# Patient Record
Sex: Male | Born: 1955
Health system: Southern US, Community
[De-identification: ages and names within clinical notes are randomized; demographics above are authoritative.]

## PROBLEM LIST (undated history)

## (undated) ENCOUNTER — Ambulatory Visit (HOSPITAL_BASED_OUTPATIENT_CLINIC_OR_DEPARTMENT_OTHER): Payer: PPO

## (undated) DIAGNOSIS — M791 Myalgia, unspecified site: Secondary | ICD-10-CM

## (undated) DIAGNOSIS — R413 Other amnesia: Secondary | ICD-10-CM

## (undated) DIAGNOSIS — M109 Gout, unspecified: Secondary | ICD-10-CM

## (undated) DIAGNOSIS — T7840XA Allergy, unspecified, initial encounter: Secondary | ICD-10-CM

## (undated) DIAGNOSIS — E119 Type 2 diabetes mellitus without complications: Secondary | ICD-10-CM

## (undated) DIAGNOSIS — E785 Hyperlipidemia, unspecified: Secondary | ICD-10-CM

## (undated) DIAGNOSIS — K5730 Diverticulosis of large intestine without perforation or abscess without bleeding: Secondary | ICD-10-CM

## (undated) DIAGNOSIS — D689 Coagulation defect, unspecified: Secondary | ICD-10-CM

## (undated) DIAGNOSIS — G4730 Sleep apnea, unspecified: Secondary | ICD-10-CM

## (undated) DIAGNOSIS — M810 Age-related osteoporosis without current pathological fracture: Secondary | ICD-10-CM

## (undated) DIAGNOSIS — F32A Depression, unspecified: Secondary | ICD-10-CM

## (undated) DIAGNOSIS — D649 Anemia, unspecified: Secondary | ICD-10-CM

## (undated) DIAGNOSIS — I10 Essential (primary) hypertension: Secondary | ICD-10-CM

## (undated) DIAGNOSIS — M1990 Unspecified osteoarthritis, unspecified site: Secondary | ICD-10-CM

## (undated) DIAGNOSIS — R5383 Other fatigue: Secondary | ICD-10-CM

## (undated) DIAGNOSIS — F329 Major depressive disorder, single episode, unspecified: Secondary | ICD-10-CM

## (undated) HISTORY — DX: Other amnesia: R41.3

## (undated) HISTORY — DX: Depression, unspecified: F32.A

## (undated) HISTORY — DX: Unspecified osteoarthritis, unspecified site: M19.90

## (undated) HISTORY — PX: SMALL INTESTINE SURGERY: SHX150

## (undated) HISTORY — DX: Hyperlipidemia, unspecified: E78.5

## (undated) HISTORY — DX: Diverticulosis of large intestine without perforation or abscess without bleeding: K57.30

## (undated) HISTORY — PX: LEG SURGERY: SHX1003

## (undated) HISTORY — DX: Type 2 diabetes mellitus without complications: E11.9

## (undated) HISTORY — DX: Age-related osteoporosis without current pathological fracture: M81.0

## (undated) HISTORY — PX: OTHER SURGICAL HISTORY: SHX169

## (undated) HISTORY — DX: Gout, unspecified: M10.9

## (undated) HISTORY — DX: Essential (primary) hypertension: I10

## (undated) HISTORY — PX: CARPAL TUNNEL WITH CUBITAL TUNNEL: SHX5608

## (undated) HISTORY — PX: NOSE SURGERY: SHX723

## (undated) HISTORY — DX: Anemia, unspecified: D64.9

## (undated) HISTORY — DX: Allergy, unspecified, initial encounter: T78.40XA

## (undated) HISTORY — DX: Myalgia, unspecified site: M79.10

## (undated) HISTORY — DX: Major depressive disorder, single episode, unspecified: F32.9

## (undated) HISTORY — DX: Sleep apnea, unspecified: G47.30

## (undated) HISTORY — DX: Other fatigue: R53.83

## (undated) HISTORY — DX: Coagulation defect, unspecified: D68.9

---

## 2002-10-03 DIAGNOSIS — C179 Malignant neoplasm of small intestine, unspecified: Secondary | ICD-10-CM | POA: Insufficient documentation

## 2003-01-10 DIAGNOSIS — Z5189 Encounter for other specified aftercare: Secondary | ICD-10-CM

## 2003-01-10 DIAGNOSIS — C801 Malignant (primary) neoplasm, unspecified: Secondary | ICD-10-CM

## 2003-01-10 HISTORY — DX: Malignant (primary) neoplasm, unspecified: C80.1

## 2003-01-10 HISTORY — DX: Encounter for other specified aftercare: Z51.89

## 2003-12-16 ENCOUNTER — Ambulatory Visit: Payer: Self-pay | Admitting: Oncology

## 2004-03-09 ENCOUNTER — Ambulatory Visit: Payer: Self-pay | Admitting: Oncology

## 2004-06-15 ENCOUNTER — Ambulatory Visit: Payer: Self-pay | Admitting: Oncology

## 2004-09-22 ENCOUNTER — Ambulatory Visit: Payer: Self-pay | Admitting: Oncology

## 2005-02-10 ENCOUNTER — Ambulatory Visit: Payer: Self-pay | Admitting: Oncology

## 2005-06-16 ENCOUNTER — Ambulatory Visit: Payer: Self-pay | Admitting: Oncology

## 2005-08-15 ENCOUNTER — Ambulatory Visit: Payer: Self-pay | Admitting: Oncology

## 2005-10-06 ENCOUNTER — Ambulatory Visit: Payer: Self-pay | Admitting: Oncology

## 2006-01-04 ENCOUNTER — Ambulatory Visit: Payer: Self-pay | Admitting: Oncology

## 2006-03-30 ENCOUNTER — Ambulatory Visit: Payer: Self-pay | Admitting: Oncology

## 2006-07-03 ENCOUNTER — Ambulatory Visit: Payer: Self-pay | Admitting: Oncology

## 2006-09-14 ENCOUNTER — Ambulatory Visit: Payer: Self-pay | Admitting: Oncology

## 2011-04-06 DIAGNOSIS — E1149 Type 2 diabetes mellitus with other diabetic neurological complication: Secondary | ICD-10-CM | POA: Diagnosis not present

## 2011-04-06 DIAGNOSIS — L608 Other nail disorders: Secondary | ICD-10-CM | POA: Diagnosis not present

## 2011-05-31 DIAGNOSIS — Z452 Encounter for adjustment and management of vascular access device: Secondary | ICD-10-CM | POA: Diagnosis not present

## 2011-05-31 DIAGNOSIS — C179 Malignant neoplasm of small intestine, unspecified: Secondary | ICD-10-CM | POA: Diagnosis not present

## 2011-08-24 DIAGNOSIS — E1149 Type 2 diabetes mellitus with other diabetic neurological complication: Secondary | ICD-10-CM | POA: Diagnosis not present

## 2011-08-24 DIAGNOSIS — L608 Other nail disorders: Secondary | ICD-10-CM | POA: Diagnosis not present

## 2011-11-15 DIAGNOSIS — IMO0002 Reserved for concepts with insufficient information to code with codable children: Secondary | ICD-10-CM | POA: Diagnosis not present

## 2011-11-15 DIAGNOSIS — E785 Hyperlipidemia, unspecified: Secondary | ICD-10-CM | POA: Diagnosis not present

## 2011-11-15 DIAGNOSIS — M109 Gout, unspecified: Secondary | ICD-10-CM | POA: Diagnosis not present

## 2011-11-15 DIAGNOSIS — E119 Type 2 diabetes mellitus without complications: Secondary | ICD-10-CM | POA: Diagnosis not present

## 2011-11-15 DIAGNOSIS — I10 Essential (primary) hypertension: Secondary | ICD-10-CM | POA: Diagnosis not present

## 2011-11-15 DIAGNOSIS — E291 Testicular hypofunction: Secondary | ICD-10-CM | POA: Diagnosis not present

## 2011-11-23 DIAGNOSIS — E1149 Type 2 diabetes mellitus with other diabetic neurological complication: Secondary | ICD-10-CM | POA: Diagnosis not present

## 2011-11-23 DIAGNOSIS — L608 Other nail disorders: Secondary | ICD-10-CM | POA: Diagnosis not present

## 2011-12-13 DIAGNOSIS — E119 Type 2 diabetes mellitus without complications: Secondary | ICD-10-CM | POA: Diagnosis not present

## 2011-12-13 DIAGNOSIS — R072 Precordial pain: Secondary | ICD-10-CM | POA: Diagnosis not present

## 2011-12-13 DIAGNOSIS — I10 Essential (primary) hypertension: Secondary | ICD-10-CM | POA: Diagnosis not present

## 2011-12-13 DIAGNOSIS — M109 Gout, unspecified: Secondary | ICD-10-CM | POA: Diagnosis not present

## 2011-12-13 DIAGNOSIS — R5383 Other fatigue: Secondary | ICD-10-CM | POA: Diagnosis not present

## 2011-12-13 DIAGNOSIS — R0602 Shortness of breath: Secondary | ICD-10-CM | POA: Diagnosis not present

## 2011-12-13 DIAGNOSIS — Z79899 Other long term (current) drug therapy: Secondary | ICD-10-CM | POA: Diagnosis not present

## 2011-12-13 DIAGNOSIS — Z8509 Personal history of malignant neoplasm of other digestive organs: Secondary | ICD-10-CM | POA: Diagnosis not present

## 2011-12-13 DIAGNOSIS — Z6841 Body Mass Index (BMI) 40.0 and over, adult: Secondary | ICD-10-CM | POA: Diagnosis not present

## 2011-12-13 DIAGNOSIS — R5381 Other malaise: Secondary | ICD-10-CM | POA: Diagnosis not present

## 2011-12-13 DIAGNOSIS — K219 Gastro-esophageal reflux disease without esophagitis: Secondary | ICD-10-CM | POA: Diagnosis not present

## 2011-12-13 DIAGNOSIS — E291 Testicular hypofunction: Secondary | ICD-10-CM | POA: Diagnosis not present

## 2011-12-14 DIAGNOSIS — Z0389 Encounter for observation for other suspected diseases and conditions ruled out: Secondary | ICD-10-CM | POA: Diagnosis not present

## 2011-12-14 DIAGNOSIS — R079 Chest pain, unspecified: Secondary | ICD-10-CM | POA: Diagnosis not present

## 2011-12-14 DIAGNOSIS — R0789 Other chest pain: Secondary | ICD-10-CM | POA: Diagnosis not present

## 2011-12-14 DIAGNOSIS — M109 Gout, unspecified: Secondary | ICD-10-CM | POA: Diagnosis not present

## 2011-12-14 DIAGNOSIS — I10 Essential (primary) hypertension: Secondary | ICD-10-CM | POA: Diagnosis not present

## 2011-12-15 DIAGNOSIS — M109 Gout, unspecified: Secondary | ICD-10-CM | POA: Diagnosis not present

## 2011-12-15 DIAGNOSIS — I10 Essential (primary) hypertension: Secondary | ICD-10-CM | POA: Diagnosis not present

## 2011-12-15 DIAGNOSIS — R079 Chest pain, unspecified: Secondary | ICD-10-CM | POA: Diagnosis not present

## 2011-12-27 DIAGNOSIS — I10 Essential (primary) hypertension: Secondary | ICD-10-CM | POA: Diagnosis not present

## 2011-12-27 DIAGNOSIS — Z6841 Body Mass Index (BMI) 40.0 and over, adult: Secondary | ICD-10-CM | POA: Diagnosis not present

## 2011-12-27 DIAGNOSIS — K219 Gastro-esophageal reflux disease without esophagitis: Secondary | ICD-10-CM | POA: Diagnosis not present

## 2011-12-27 DIAGNOSIS — R0789 Other chest pain: Secondary | ICD-10-CM | POA: Diagnosis not present

## 2012-02-12 DIAGNOSIS — M109 Gout, unspecified: Secondary | ICD-10-CM | POA: Diagnosis not present

## 2012-02-12 DIAGNOSIS — N390 Urinary tract infection, site not specified: Secondary | ICD-10-CM | POA: Diagnosis not present

## 2012-02-12 DIAGNOSIS — E119 Type 2 diabetes mellitus without complications: Secondary | ICD-10-CM | POA: Diagnosis not present

## 2012-02-12 DIAGNOSIS — I10 Essential (primary) hypertension: Secondary | ICD-10-CM | POA: Diagnosis not present

## 2012-02-12 DIAGNOSIS — Z6841 Body Mass Index (BMI) 40.0 and over, adult: Secondary | ICD-10-CM | POA: Diagnosis not present

## 2012-02-12 DIAGNOSIS — E291 Testicular hypofunction: Secondary | ICD-10-CM | POA: Diagnosis not present

## 2012-03-13 DIAGNOSIS — J019 Acute sinusitis, unspecified: Secondary | ICD-10-CM | POA: Diagnosis not present

## 2012-03-13 DIAGNOSIS — Z85038 Personal history of other malignant neoplasm of large intestine: Secondary | ICD-10-CM | POA: Diagnosis not present

## 2012-03-13 DIAGNOSIS — Z09 Encounter for follow-up examination after completed treatment for conditions other than malignant neoplasm: Secondary | ICD-10-CM | POA: Diagnosis not present

## 2012-03-14 DIAGNOSIS — L608 Other nail disorders: Secondary | ICD-10-CM | POA: Diagnosis not present

## 2012-03-14 DIAGNOSIS — E1149 Type 2 diabetes mellitus with other diabetic neurological complication: Secondary | ICD-10-CM | POA: Diagnosis not present

## 2012-05-30 DIAGNOSIS — M109 Gout, unspecified: Secondary | ICD-10-CM | POA: Diagnosis not present

## 2012-05-30 DIAGNOSIS — E119 Type 2 diabetes mellitus without complications: Secondary | ICD-10-CM | POA: Diagnosis not present

## 2012-05-30 DIAGNOSIS — I10 Essential (primary) hypertension: Secondary | ICD-10-CM | POA: Diagnosis not present

## 2012-05-30 DIAGNOSIS — E291 Testicular hypofunction: Secondary | ICD-10-CM | POA: Diagnosis not present

## 2012-05-31 DIAGNOSIS — M79609 Pain in unspecified limb: Secondary | ICD-10-CM | POA: Diagnosis not present

## 2012-05-31 DIAGNOSIS — I10 Essential (primary) hypertension: Secondary | ICD-10-CM | POA: Diagnosis not present

## 2012-05-31 DIAGNOSIS — M7989 Other specified soft tissue disorders: Secondary | ICD-10-CM | POA: Diagnosis not present

## 2012-06-01 DIAGNOSIS — M79609 Pain in unspecified limb: Secondary | ICD-10-CM | POA: Diagnosis not present

## 2012-06-01 DIAGNOSIS — R609 Edema, unspecified: Secondary | ICD-10-CM | POA: Diagnosis not present

## 2012-06-01 DIAGNOSIS — IMO0002 Reserved for concepts with insufficient information to code with codable children: Secondary | ICD-10-CM | POA: Diagnosis not present

## 2012-06-01 DIAGNOSIS — M7989 Other specified soft tissue disorders: Secondary | ICD-10-CM | POA: Diagnosis not present

## 2012-06-01 DIAGNOSIS — I10 Essential (primary) hypertension: Secondary | ICD-10-CM | POA: Diagnosis not present

## 2012-06-02 DIAGNOSIS — E119 Type 2 diabetes mellitus without complications: Secondary | ICD-10-CM | POA: Diagnosis not present

## 2012-06-02 DIAGNOSIS — I10 Essential (primary) hypertension: Secondary | ICD-10-CM | POA: Diagnosis not present

## 2012-06-02 DIAGNOSIS — IMO0002 Reserved for concepts with insufficient information to code with codable children: Secondary | ICD-10-CM | POA: Diagnosis not present

## 2012-06-28 DIAGNOSIS — E119 Type 2 diabetes mellitus without complications: Secondary | ICD-10-CM | POA: Diagnosis not present

## 2012-07-18 DIAGNOSIS — L608 Other nail disorders: Secondary | ICD-10-CM | POA: Diagnosis not present

## 2012-07-18 DIAGNOSIS — E1149 Type 2 diabetes mellitus with other diabetic neurological complication: Secondary | ICD-10-CM | POA: Diagnosis not present

## 2012-09-13 DIAGNOSIS — C179 Malignant neoplasm of small intestine, unspecified: Secondary | ICD-10-CM | POA: Diagnosis not present

## 2012-09-13 DIAGNOSIS — Z452 Encounter for adjustment and management of vascular access device: Secondary | ICD-10-CM | POA: Diagnosis not present

## 2012-09-23 DIAGNOSIS — R42 Dizziness and giddiness: Secondary | ICD-10-CM | POA: Diagnosis not present

## 2012-09-23 DIAGNOSIS — I059 Rheumatic mitral valve disease, unspecified: Secondary | ICD-10-CM | POA: Diagnosis present

## 2012-09-23 DIAGNOSIS — G4733 Obstructive sleep apnea (adult) (pediatric): Secondary | ICD-10-CM | POA: Diagnosis not present

## 2012-09-23 DIAGNOSIS — R9431 Abnormal electrocardiogram [ECG] [EKG]: Secondary | ICD-10-CM | POA: Diagnosis not present

## 2012-09-23 DIAGNOSIS — I2699 Other pulmonary embolism without acute cor pulmonale: Secondary | ICD-10-CM | POA: Diagnosis not present

## 2012-09-23 DIAGNOSIS — I82409 Acute embolism and thrombosis of unspecified deep veins of unspecified lower extremity: Secondary | ICD-10-CM | POA: Diagnosis not present

## 2012-09-23 DIAGNOSIS — I2119 ST elevation (STEMI) myocardial infarction involving other coronary artery of inferior wall: Secondary | ICD-10-CM | POA: Diagnosis not present

## 2012-09-23 DIAGNOSIS — Z87891 Personal history of nicotine dependence: Secondary | ICD-10-CM | POA: Diagnosis not present

## 2012-09-23 DIAGNOSIS — I519 Heart disease, unspecified: Secondary | ICD-10-CM | POA: Diagnosis not present

## 2012-09-23 DIAGNOSIS — Z9889 Other specified postprocedural states: Secondary | ICD-10-CM | POA: Diagnosis not present

## 2012-09-23 DIAGNOSIS — R079 Chest pain, unspecified: Secondary | ICD-10-CM | POA: Diagnosis not present

## 2012-09-23 DIAGNOSIS — Z9221 Personal history of antineoplastic chemotherapy: Secondary | ICD-10-CM | POA: Diagnosis not present

## 2012-09-23 DIAGNOSIS — I2692 Saddle embolus of pulmonary artery without acute cor pulmonale: Secondary | ICD-10-CM | POA: Insufficient documentation

## 2012-09-23 DIAGNOSIS — Z8509 Personal history of malignant neoplasm of other digestive organs: Secondary | ICD-10-CM | POA: Diagnosis not present

## 2012-09-23 DIAGNOSIS — E119 Type 2 diabetes mellitus without complications: Secondary | ICD-10-CM | POA: Diagnosis not present

## 2012-09-23 DIAGNOSIS — C179 Malignant neoplasm of small intestine, unspecified: Secondary | ICD-10-CM | POA: Diagnosis not present

## 2012-09-23 DIAGNOSIS — Z452 Encounter for adjustment and management of vascular access device: Secondary | ICD-10-CM | POA: Diagnosis not present

## 2012-09-23 DIAGNOSIS — I369 Nonrheumatic tricuspid valve disorder, unspecified: Secondary | ICD-10-CM | POA: Diagnosis present

## 2012-09-23 DIAGNOSIS — R0602 Shortness of breath: Secondary | ICD-10-CM | POA: Diagnosis not present

## 2012-09-23 DIAGNOSIS — I10 Essential (primary) hypertension: Secondary | ICD-10-CM | POA: Diagnosis not present

## 2012-09-23 DIAGNOSIS — M109 Gout, unspecified: Secondary | ICD-10-CM | POA: Diagnosis not present

## 2012-09-23 DIAGNOSIS — I379 Nonrheumatic pulmonary valve disorder, unspecified: Secondary | ICD-10-CM | POA: Diagnosis present

## 2012-09-26 DIAGNOSIS — I82402 Acute embolism and thrombosis of unspecified deep veins of left lower extremity: Secondary | ICD-10-CM | POA: Insufficient documentation

## 2012-09-26 DIAGNOSIS — M109 Gout, unspecified: Secondary | ICD-10-CM | POA: Insufficient documentation

## 2012-10-02 DIAGNOSIS — I2692 Saddle embolus of pulmonary artery without acute cor pulmonale: Secondary | ICD-10-CM | POA: Diagnosis not present

## 2012-10-02 DIAGNOSIS — I743 Embolism and thrombosis of arteries of the lower extremities: Secondary | ICD-10-CM | POA: Diagnosis not present

## 2012-10-03 DIAGNOSIS — R609 Edema, unspecified: Secondary | ICD-10-CM | POA: Diagnosis not present

## 2012-10-03 DIAGNOSIS — Z79899 Other long term (current) drug therapy: Secondary | ICD-10-CM | POA: Diagnosis not present

## 2012-10-03 DIAGNOSIS — E119 Type 2 diabetes mellitus without complications: Secondary | ICD-10-CM | POA: Diagnosis not present

## 2012-10-03 DIAGNOSIS — I743 Embolism and thrombosis of arteries of the lower extremities: Secondary | ICD-10-CM | POA: Diagnosis not present

## 2012-10-03 DIAGNOSIS — I2692 Saddle embolus of pulmonary artery without acute cor pulmonale: Secondary | ICD-10-CM | POA: Diagnosis not present

## 2012-10-03 DIAGNOSIS — Z6841 Body Mass Index (BMI) 40.0 and over, adult: Secondary | ICD-10-CM | POA: Diagnosis not present

## 2012-10-03 DIAGNOSIS — I10 Essential (primary) hypertension: Secondary | ICD-10-CM | POA: Diagnosis not present

## 2012-10-07 DIAGNOSIS — I743 Embolism and thrombosis of arteries of the lower extremities: Secondary | ICD-10-CM | POA: Diagnosis not present

## 2012-10-07 DIAGNOSIS — I2692 Saddle embolus of pulmonary artery without acute cor pulmonale: Secondary | ICD-10-CM | POA: Diagnosis not present

## 2012-10-08 DIAGNOSIS — IMO0002 Reserved for concepts with insufficient information to code with codable children: Secondary | ICD-10-CM | POA: Diagnosis not present

## 2012-10-08 DIAGNOSIS — J869 Pyothorax without fistula: Secondary | ICD-10-CM | POA: Diagnosis not present

## 2012-10-08 DIAGNOSIS — I10 Essential (primary) hypertension: Secondary | ICD-10-CM | POA: Diagnosis not present

## 2012-10-08 DIAGNOSIS — Z7901 Long term (current) use of anticoagulants: Secondary | ICD-10-CM | POA: Diagnosis not present

## 2012-10-08 DIAGNOSIS — L02219 Cutaneous abscess of trunk, unspecified: Secondary | ICD-10-CM | POA: Diagnosis not present

## 2012-10-08 DIAGNOSIS — E119 Type 2 diabetes mellitus without complications: Secondary | ICD-10-CM | POA: Diagnosis not present

## 2012-10-08 DIAGNOSIS — Z23 Encounter for immunization: Secondary | ICD-10-CM | POA: Diagnosis not present

## 2012-10-10 DIAGNOSIS — Z6839 Body mass index (BMI) 39.0-39.9, adult: Secondary | ICD-10-CM | POA: Diagnosis not present

## 2012-10-10 DIAGNOSIS — E119 Type 2 diabetes mellitus without complications: Secondary | ICD-10-CM | POA: Diagnosis not present

## 2012-10-10 DIAGNOSIS — I10 Essential (primary) hypertension: Secondary | ICD-10-CM | POA: Diagnosis not present

## 2012-10-10 DIAGNOSIS — I82409 Acute embolism and thrombosis of unspecified deep veins of unspecified lower extremity: Secondary | ICD-10-CM | POA: Diagnosis not present

## 2012-10-10 DIAGNOSIS — R791 Abnormal coagulation profile: Secondary | ICD-10-CM | POA: Diagnosis not present

## 2012-10-10 DIAGNOSIS — I2692 Saddle embolus of pulmonary artery without acute cor pulmonale: Secondary | ICD-10-CM | POA: Diagnosis not present

## 2012-10-10 DIAGNOSIS — Z79899 Other long term (current) drug therapy: Secondary | ICD-10-CM | POA: Diagnosis not present

## 2012-10-10 DIAGNOSIS — I743 Embolism and thrombosis of arteries of the lower extremities: Secondary | ICD-10-CM | POA: Diagnosis not present

## 2012-10-11 DIAGNOSIS — I2692 Saddle embolus of pulmonary artery without acute cor pulmonale: Secondary | ICD-10-CM | POA: Diagnosis not present

## 2012-10-11 DIAGNOSIS — I743 Embolism and thrombosis of arteries of the lower extremities: Secondary | ICD-10-CM | POA: Diagnosis not present

## 2012-10-13 DIAGNOSIS — I743 Embolism and thrombosis of arteries of the lower extremities: Secondary | ICD-10-CM | POA: Diagnosis not present

## 2012-10-13 DIAGNOSIS — I2692 Saddle embolus of pulmonary artery without acute cor pulmonale: Secondary | ICD-10-CM | POA: Diagnosis not present

## 2012-10-14 DIAGNOSIS — I743 Embolism and thrombosis of arteries of the lower extremities: Secondary | ICD-10-CM | POA: Diagnosis not present

## 2012-10-14 DIAGNOSIS — I2692 Saddle embolus of pulmonary artery without acute cor pulmonale: Secondary | ICD-10-CM | POA: Diagnosis not present

## 2012-10-15 DIAGNOSIS — L02219 Cutaneous abscess of trunk, unspecified: Secondary | ICD-10-CM | POA: Diagnosis not present

## 2012-10-16 DIAGNOSIS — I2692 Saddle embolus of pulmonary artery without acute cor pulmonale: Secondary | ICD-10-CM | POA: Diagnosis not present

## 2012-10-16 DIAGNOSIS — I743 Embolism and thrombosis of arteries of the lower extremities: Secondary | ICD-10-CM | POA: Diagnosis not present

## 2012-10-17 ENCOUNTER — Ambulatory Visit: Payer: Self-pay

## 2012-10-17 DIAGNOSIS — I2692 Saddle embolus of pulmonary artery without acute cor pulmonale: Secondary | ICD-10-CM | POA: Diagnosis not present

## 2012-10-17 DIAGNOSIS — I743 Embolism and thrombosis of arteries of the lower extremities: Secondary | ICD-10-CM | POA: Diagnosis not present

## 2012-10-21 DIAGNOSIS — I743 Embolism and thrombosis of arteries of the lower extremities: Secondary | ICD-10-CM | POA: Diagnosis not present

## 2012-10-21 DIAGNOSIS — I2692 Saddle embolus of pulmonary artery without acute cor pulmonale: Secondary | ICD-10-CM | POA: Diagnosis not present

## 2012-10-23 DIAGNOSIS — I2692 Saddle embolus of pulmonary artery without acute cor pulmonale: Secondary | ICD-10-CM | POA: Diagnosis not present

## 2012-10-23 DIAGNOSIS — I743 Embolism and thrombosis of arteries of the lower extremities: Secondary | ICD-10-CM | POA: Diagnosis not present

## 2012-10-24 DIAGNOSIS — I2692 Saddle embolus of pulmonary artery without acute cor pulmonale: Secondary | ICD-10-CM | POA: Diagnosis not present

## 2012-10-24 DIAGNOSIS — I743 Embolism and thrombosis of arteries of the lower extremities: Secondary | ICD-10-CM | POA: Diagnosis not present

## 2012-11-07 DIAGNOSIS — D649 Anemia, unspecified: Secondary | ICD-10-CM | POA: Diagnosis not present

## 2012-11-08 DIAGNOSIS — R791 Abnormal coagulation profile: Secondary | ICD-10-CM | POA: Diagnosis not present

## 2012-11-08 DIAGNOSIS — S21109A Unspecified open wound of unspecified front wall of thorax without penetration into thoracic cavity, initial encounter: Secondary | ICD-10-CM | POA: Diagnosis not present

## 2012-11-08 DIAGNOSIS — M5137 Other intervertebral disc degeneration, lumbosacral region: Secondary | ICD-10-CM | POA: Diagnosis not present

## 2012-11-08 DIAGNOSIS — M1710 Unilateral primary osteoarthritis, unspecified knee: Secondary | ICD-10-CM | POA: Diagnosis not present

## 2012-11-08 DIAGNOSIS — Z6839 Body mass index (BMI) 39.0-39.9, adult: Secondary | ICD-10-CM | POA: Diagnosis not present

## 2012-11-15 DIAGNOSIS — E119 Type 2 diabetes mellitus without complications: Secondary | ICD-10-CM | POA: Diagnosis not present

## 2012-11-15 DIAGNOSIS — I10 Essential (primary) hypertension: Secondary | ICD-10-CM | POA: Diagnosis not present

## 2012-11-15 DIAGNOSIS — I82409 Acute embolism and thrombosis of unspecified deep veins of unspecified lower extremity: Secondary | ICD-10-CM | POA: Diagnosis not present

## 2012-11-15 DIAGNOSIS — Z79899 Other long term (current) drug therapy: Secondary | ICD-10-CM | POA: Diagnosis not present

## 2012-11-15 DIAGNOSIS — I2692 Saddle embolus of pulmonary artery without acute cor pulmonale: Secondary | ICD-10-CM | POA: Diagnosis not present

## 2012-11-15 DIAGNOSIS — R791 Abnormal coagulation profile: Secondary | ICD-10-CM | POA: Diagnosis not present

## 2012-11-29 DIAGNOSIS — Z7901 Long term (current) use of anticoagulants: Secondary | ICD-10-CM | POA: Diagnosis not present

## 2012-12-13 DIAGNOSIS — Z7901 Long term (current) use of anticoagulants: Secondary | ICD-10-CM | POA: Diagnosis not present

## 2012-12-20 DIAGNOSIS — E119 Type 2 diabetes mellitus without complications: Secondary | ICD-10-CM | POA: Diagnosis not present

## 2012-12-20 DIAGNOSIS — I82409 Acute embolism and thrombosis of unspecified deep veins of unspecified lower extremity: Secondary | ICD-10-CM | POA: Diagnosis not present

## 2012-12-20 DIAGNOSIS — I10 Essential (primary) hypertension: Secondary | ICD-10-CM | POA: Diagnosis not present

## 2012-12-20 DIAGNOSIS — I2692 Saddle embolus of pulmonary artery without acute cor pulmonale: Secondary | ICD-10-CM | POA: Diagnosis not present

## 2012-12-20 DIAGNOSIS — Z79899 Other long term (current) drug therapy: Secondary | ICD-10-CM | POA: Diagnosis not present

## 2013-01-14 DIAGNOSIS — Z7901 Long term (current) use of anticoagulants: Secondary | ICD-10-CM | POA: Diagnosis not present

## 2013-01-30 DIAGNOSIS — I82409 Acute embolism and thrombosis of unspecified deep veins of unspecified lower extremity: Secondary | ICD-10-CM | POA: Diagnosis not present

## 2013-01-30 DIAGNOSIS — Z452 Encounter for adjustment and management of vascular access device: Secondary | ICD-10-CM | POA: Diagnosis not present

## 2013-01-31 DIAGNOSIS — H6120 Impacted cerumen, unspecified ear: Secondary | ICD-10-CM | POA: Diagnosis not present

## 2013-01-31 DIAGNOSIS — Z6841 Body Mass Index (BMI) 40.0 and over, adult: Secondary | ICD-10-CM | POA: Diagnosis not present

## 2013-01-31 DIAGNOSIS — Z125 Encounter for screening for malignant neoplasm of prostate: Secondary | ICD-10-CM | POA: Diagnosis not present

## 2013-01-31 DIAGNOSIS — E119 Type 2 diabetes mellitus without complications: Secondary | ICD-10-CM | POA: Diagnosis not present

## 2013-01-31 DIAGNOSIS — I10 Essential (primary) hypertension: Secondary | ICD-10-CM | POA: Diagnosis not present

## 2013-01-31 DIAGNOSIS — E785 Hyperlipidemia, unspecified: Secondary | ICD-10-CM | POA: Diagnosis not present

## 2013-01-31 DIAGNOSIS — Z79899 Other long term (current) drug therapy: Secondary | ICD-10-CM | POA: Diagnosis not present

## 2013-01-31 DIAGNOSIS — M109 Gout, unspecified: Secondary | ICD-10-CM | POA: Diagnosis not present

## 2013-02-13 ENCOUNTER — Ambulatory Visit: Payer: Self-pay

## 2013-03-08 DIAGNOSIS — S21109A Unspecified open wound of unspecified front wall of thorax without penetration into thoracic cavity, initial encounter: Secondary | ICD-10-CM | POA: Diagnosis not present

## 2013-03-08 DIAGNOSIS — Z6841 Body Mass Index (BMI) 40.0 and over, adult: Secondary | ICD-10-CM | POA: Diagnosis not present

## 2013-03-08 DIAGNOSIS — B356 Tinea cruris: Secondary | ICD-10-CM | POA: Diagnosis not present

## 2013-03-08 DIAGNOSIS — Z7901 Long term (current) use of anticoagulants: Secondary | ICD-10-CM | POA: Diagnosis not present

## 2013-03-13 DIAGNOSIS — Z7901 Long term (current) use of anticoagulants: Secondary | ICD-10-CM | POA: Diagnosis not present

## 2013-04-15 DIAGNOSIS — R791 Abnormal coagulation profile: Secondary | ICD-10-CM | POA: Diagnosis not present

## 2013-04-22 DIAGNOSIS — R791 Abnormal coagulation profile: Secondary | ICD-10-CM | POA: Diagnosis not present

## 2013-04-29 DIAGNOSIS — R791 Abnormal coagulation profile: Secondary | ICD-10-CM | POA: Diagnosis not present

## 2013-05-07 DIAGNOSIS — M109 Gout, unspecified: Secondary | ICD-10-CM | POA: Diagnosis not present

## 2013-05-07 DIAGNOSIS — Z6839 Body mass index (BMI) 39.0-39.9, adult: Secondary | ICD-10-CM | POA: Diagnosis not present

## 2013-05-07 DIAGNOSIS — E1149 Type 2 diabetes mellitus with other diabetic neurological complication: Secondary | ICD-10-CM | POA: Diagnosis not present

## 2013-05-07 DIAGNOSIS — E785 Hyperlipidemia, unspecified: Secondary | ICD-10-CM | POA: Diagnosis not present

## 2013-05-07 DIAGNOSIS — I10 Essential (primary) hypertension: Secondary | ICD-10-CM | POA: Diagnosis not present

## 2013-05-07 DIAGNOSIS — Z79899 Other long term (current) drug therapy: Secondary | ICD-10-CM | POA: Diagnosis not present

## 2013-05-08 ENCOUNTER — Ambulatory Visit (INDEPENDENT_AMBULATORY_CARE_PROVIDER_SITE_OTHER): Payer: Medicare Other

## 2013-05-08 VITALS — BP 156/109 | HR 70 | Resp 18

## 2013-05-08 DIAGNOSIS — E1140 Type 2 diabetes mellitus with diabetic neuropathy, unspecified: Secondary | ICD-10-CM

## 2013-05-08 DIAGNOSIS — L97509 Non-pressure chronic ulcer of other part of unspecified foot with unspecified severity: Secondary | ICD-10-CM

## 2013-05-08 DIAGNOSIS — M2020 Hallux rigidus, unspecified foot: Secondary | ICD-10-CM

## 2013-05-08 DIAGNOSIS — M1990 Unspecified osteoarthritis, unspecified site: Secondary | ICD-10-CM

## 2013-05-08 DIAGNOSIS — E1149 Type 2 diabetes mellitus with other diabetic neurological complication: Secondary | ICD-10-CM | POA: Diagnosis not present

## 2013-05-08 DIAGNOSIS — Q828 Other specified congenital malformations of skin: Secondary | ICD-10-CM

## 2013-05-08 DIAGNOSIS — L608 Other nail disorders: Secondary | ICD-10-CM | POA: Diagnosis not present

## 2013-05-08 MED ORDER — SILVER SULFADIAZINE 1 % EX CREA: 1.0000 "application " | TOPICAL_CREAM | Freq: Every day | CUTANEOUS | Status: DC

## 2013-05-08 NOTE — Patient Instructions (Signed)
Diabetes and Foot Care Diabetes may cause you to have problems because of poor blood supply (circulation) to your feet and legs. This may cause the skin on your feet to become thinner, break easier, and heal more slowly. Your skin may become dry, and the skin may peel and crack. You may also have nerve damage in your legs and feet causing decreased feeling in them. You may not notice minor injuries to your feet that could lead to infections or more serious problems. Taking care of your feet is one of the most important things you can do for yourself.  HOME CARE INSTRUCTIONS  Wear shoes at all times, even in the house. Do not go barefoot. Bare feet are easily injured.  Check your feet daily for blisters, cuts, and redness. If you cannot see the bottom of your feet, use a mirror or ask someone for help.  Wash your feet with warm water (do not use hot water) and mild soap. Then pat your feet and the areas between your toes until they are completely dry. Do not soak your feet as this can dry your skin.  Apply a moisturizing lotion or petroleum jelly (that does not contain alcohol and is unscented) to the skin on your feet and to dry, brittle toenails. Do not apply lotion between your toes.  Trim your toenails straight across. Do not dig under them or around the cuticle. File the edges of your nails with an emery board or nail file.  Do not cut corns or calluses or try to remove them with medicine.  Wear clean socks or stockings every day. Make sure they are not too tight. Do not wear knee-high stockings since they may decrease blood flow to your legs.  Wear shoes that fit properly and have enough cushioning. To break in new shoes, wear them for just a few hours a day. This prevents you from injuring your feet. Always look in your shoes before you put them on to be sure there are no objects inside.  Do not cross your legs. This may decrease the blood flow to your feet.  If you find a minor scrape,  cut, or break in the skin on your feet, keep it and the skin around it clean and dry. These areas may be cleansed with mild soap and water. Do not cleanse the area with peroxide, alcohol, or iodine.  When you remove an adhesive bandage, be sure not to damage the skin around it.  If you have a wound, look at it several times a day to make sure it is healing.  Do not use heating pads or hot water bottles. They may burn your skin. If you have lost feeling in your feet or legs, you may not know it is happening until it is too late.  Make sure your health care provider performs a complete foot exam at least annually or more often if you have foot problems. Report any cuts, sores, or bruises to your health care provider immediately. SEEK MEDICAL CARE IF:   You have an injury that is not healing.  You have cuts or breaks in the skin.  You have an ingrown nail.  You notice redness on your legs or feet.  You feel burning or tingling in your legs or feet.  You have pain or cramps in your legs and feet.  Your legs or feet are numb.  Your feet always feel cold. SEEK IMMEDIATE MEDICAL CARE IF:   There is increasing redness,   swelling, or pain in or around a wound.  There is a red line that goes up your leg.  Pus is coming from a wound.  You develop a fever or as directed by your health care provider.  You notice a bad smell coming from an ulcer or wound. Document Released: 12/24/1999 Document Revised: 08/28/2012 Document Reviewed: 06/04/2012 [employer] Patient Information 2014 [employer].  

## 2013-05-08 NOTE — Progress Notes (Signed)
   Subjective:    Patient ID: Anthony Le, male    DOB: 1955/12/10, 58 y.o.   MRN: [id number]  HPI i am here to get my toenails trimmed and to get shoes and my Dr is Delena Bali and I have a spot on my big toe on my left foot    Review of Systems  Endocrine: Positive for cold intolerance.  Genitourinary: Positive for frequency.  Musculoskeletal:       Arthritis in right hip  All other systems reviewed and are negative.      Objective:   Physical Exam 58 year old [place] American male well-developed oriented x3 presents this time for diabetic foot and nail care in for evaluation and possible new diabetic extra-depth shoes. Lower extremity objective findings follows vascular status was pedal pulses intact dorsalis pedis pulse two over four bilateral PT thready at best one over 4 bilateral mild + edema both lower extremities. Refill timed 3-4 seconds all digits skin temperature warm turgor diminished no edema rubor noted otherwise neurologically epicritic and proprioceptive sensations diminished on Semmes Weinstein testing to forefoot digits and arch there is normal plantar response DTRs not elicited dermatologically skin color pigment normal hair growth absent nails thick and brittle crumbly criptotic incurvated 1 through 5 bilateral patient also has nucleated keratotic lesion which and reveals ulceration proximally 0.6 cm sub-first left IP joint. The ulcer is clean the recent psoas lymphangitis 3 keratotic tissues identified this ulcer is debrided and dressed with Silvadene and gauze dressing will maintain Silvadene gauze dressings as instructed orthopedic biomechanical exam rectus foot type with rigid contractures of toes has bunion deformity as well as hallux limitus rigidus deformity with osteoarthropathy limiting motion great toes contributing to the ulcer formation of the left great toe. There is rigid contractures lesser digits as well patient's very large foot size and does require new diabetic  shoes currently wearing is inappropriate slip on type loafer which is causing irritation to his toes. There is no ascending cellulitis or lymphangitis no active infection discharge or drainage noted.       Assessment & Plan:  Assessment this time his diabetes with history peripheral neuropathy has severe arthropathy with hallux limitus and rigidus deformity both great toe joints and rigid digital contractures of lesser toes. There is active ulcer sub-hallux IP joint left great toe which is debrided and dressed with Silvadene and gauze dressing. Prescription for Silvadene for the patient's pharmacy to apply daily dressing changes after cleansing daily with proprioceptive water and dry sterile dressing application. Reappointed in 3-4 weeks for followup of ulcer and possible diabetic shoe measurement or fitting and sub-as needed we'll obtain appropriate authorization for diabetic extra-depth shoes from patient's primary physician Dr. Delena Bali Next progress Anthony Le DPM

## 2013-05-13 DIAGNOSIS — R791 Abnormal coagulation profile: Secondary | ICD-10-CM | POA: Diagnosis not present

## 2013-05-27 DIAGNOSIS — Z7901 Long term (current) use of anticoagulants: Secondary | ICD-10-CM | POA: Diagnosis not present

## 2013-06-05 ENCOUNTER — Ambulatory Visit (INDEPENDENT_AMBULATORY_CARE_PROVIDER_SITE_OTHER): Payer: Medicare Other

## 2013-06-05 VITALS — BP 135/88 | HR 84 | Resp 18

## 2013-06-05 DIAGNOSIS — M2020 Hallux rigidus, unspecified foot: Secondary | ICD-10-CM

## 2013-06-05 DIAGNOSIS — E1149 Type 2 diabetes mellitus with other diabetic neurological complication: Secondary | ICD-10-CM

## 2013-06-05 DIAGNOSIS — L97509 Non-pressure chronic ulcer of other part of unspecified foot with unspecified severity: Secondary | ICD-10-CM

## 2013-06-05 DIAGNOSIS — E1142 Type 2 diabetes mellitus with diabetic polyneuropathy: Secondary | ICD-10-CM

## 2013-06-05 DIAGNOSIS — E1140 Type 2 diabetes mellitus with diabetic neuropathy, unspecified: Secondary | ICD-10-CM

## 2013-06-05 NOTE — Patient Instructions (Signed)
Diabetes and Foot Care Diabetes may cause you to have problems because of poor blood supply (circulation) to your feet and legs. This may cause the skin on your feet to become thinner, break easier, and heal more slowly. Your skin may become dry, and the skin may peel and crack. You may also have nerve damage in your legs and feet causing decreased feeling in them. You may not notice minor injuries to your feet that could lead to infections or more serious problems. Taking care of your feet is one of the most important things you can do for yourself.  HOME CARE INSTRUCTIONS  Wear shoes at all times, even in the house. Do not go barefoot. Bare feet are easily injured.  Check your feet daily for blisters, cuts, and redness. If you cannot see the bottom of your feet, use a mirror or ask someone for help.  Wash your feet with warm water (do not use hot water) and mild soap. Then pat your feet and the areas between your toes until they are completely dry. Do not soak your feet as this can dry your skin.  Apply a moisturizing lotion or petroleum jelly (that does not contain alcohol and is unscented) to the skin on your feet and to dry, brittle toenails. Do not apply lotion between your toes.  Trim your toenails straight across. Do not dig under them or around the cuticle. File the edges of your nails with an emery board or nail file.  Do not cut corns or calluses or try to remove them with medicine.  Wear clean socks or stockings every day. Make sure they are not too tight. Do not wear knee-high stockings since they may decrease blood flow to your legs.  Wear shoes that fit properly and have enough cushioning. To break in new shoes, wear them for just a few hours a day. This prevents you from injuring your feet. Always look in your shoes before you put them on to be sure there are no objects inside.  Do not cross your legs. This may decrease the blood flow to your feet.  If you find a minor scrape,  cut, or break in the skin on your feet, keep it and the skin around it clean and dry. These areas may be cleansed with mild soap and water. Do not cleanse the area with peroxide, alcohol, or iodine.  When you remove an adhesive bandage, be sure not to damage the skin around it.  If you have a wound, look at it several times a day to make sure it is healing.  Do not use heating pads or hot water bottles. They may burn your skin. If you have lost feeling in your feet or legs, you may not know it is happening until it is too late.  Make sure your health care provider performs a complete foot exam at least annually or more often if you have foot problems. Report any cuts, sores, or bruises to your health care provider immediately. SEEK MEDICAL CARE IF:   You have an injury that is not healing.  You have cuts or breaks in the skin.  You have an ingrown nail.  You notice redness on your legs or feet.  You feel burning or tingling in your legs or feet.  You have pain or cramps in your legs and feet.  Your legs or feet are numb.  Your feet always feel cold. SEEK IMMEDIATE MEDICAL CARE IF:   There is increasing redness,   swelling, or pain in or around a wound.  There is a red line that goes up your leg.  Pus is coming from a wound.  You develop a fever or as directed by your health care provider.  You notice a bad smell coming from an ulcer or wound. Document Released: 12/24/1999 Document Revised: 08/28/2012 Document Reviewed: 06/04/2012 [employer] Patient Information 2014 [employer].  

## 2013-06-05 NOTE — Progress Notes (Signed)
   Subjective:    Patient ID: Anthony Le, male    DOB: 03-24-1955, 58 y.o.   MRN: [id number]  HPI I am doing better on my left big toe and I need to get measured for my shoes    Review of Systems no new findings or systemic changes noted     Objective:   Physical Exam Neurovascular status is intact pedal pulses palpable there is decreased sensation Semmes Weinstein testing to forefoot digits consistent with diabetic neuropathy the ulcer on the hallux IP joint left foot is reduced and about 2-3 mm to dermal level only in the ulcer is debrided Silvadene gauze dressing are Band-Aid are applied tubercle padding dispensed acute. Cushioned. Patient can for diabetic extra-depth shoes measurements and [employer] impressions are carried at this time patient is a size 14 14/2 size 50 she will be ordered he preferred boot type shoe with 3 pairs of custom molded dual density insoles made from [employer] impressions the foot at this time. Patient be recheck in 3-4 weeks for further followup and will likely orthotic and shoe for fitting and pick up maintain the wound care as instructed of the wound is no longer discharge or drainage however still some hemorrhage a keratoses down to dermal level is present       Assessment & Plan:  Reappointed in 3-4 weeks for followup L. ulcer check and possibly of diabetic shoes and insoles. The ulcer is debrided and dressed at this time with Silvadene gauze dressing maintain tube foam padding to cushion the toe.  Harriet Masson DPM

## 2013-06-26 ENCOUNTER — Ambulatory Visit (INDEPENDENT_AMBULATORY_CARE_PROVIDER_SITE_OTHER): Payer: Medicare Other

## 2013-06-26 VITALS — BP 130/90 | HR 91 | Resp 18

## 2013-06-26 DIAGNOSIS — E1140 Type 2 diabetes mellitus with diabetic neuropathy, unspecified: Secondary | ICD-10-CM

## 2013-06-26 DIAGNOSIS — L97509 Non-pressure chronic ulcer of other part of unspecified foot with unspecified severity: Secondary | ICD-10-CM | POA: Diagnosis not present

## 2013-06-26 DIAGNOSIS — M2020 Hallux rigidus, unspecified foot: Secondary | ICD-10-CM

## 2013-06-26 NOTE — Patient Instructions (Signed)
Diabetes and Foot Care Diabetes may cause you to have problems because of poor blood supply (circulation) to your feet and legs. This may cause the skin on your feet to become thinner, break easier, and heal more slowly. Your skin may become dry, and the skin may peel and crack. You may also have nerve damage in your legs and feet causing decreased feeling in them. You may not notice minor injuries to your feet that could lead to infections or more serious problems. Taking care of your feet is one of the most important things you can do for yourself.  HOME CARE INSTRUCTIONS  Wear shoes at all times, even in the house. Do not go barefoot. Bare feet are easily injured.  Check your feet daily for blisters, cuts, and redness. If you cannot see the bottom of your feet, use a mirror or ask someone for help.  Wash your feet with warm water (do not use hot water) and mild soap. Then pat your feet and the areas between your toes until they are completely dry. Do not soak your feet as this can dry your skin.  Apply a moisturizing lotion or petroleum jelly (that does not contain alcohol and is unscented) to the skin on your feet and to dry, brittle toenails. Do not apply lotion between your toes.  Trim your toenails straight across. Do not dig under them or around the cuticle. File the edges of your nails with an emery board or nail file.  Do not cut corns or calluses or try to remove them with medicine.  Wear clean socks or stockings every day. Make sure they are not too tight. Do not wear knee-high stockings since they may decrease blood flow to your legs.  Wear shoes that fit properly and have enough cushioning. To break in new shoes, wear them for just a few hours a day. This prevents you from injuring your feet. Always look in your shoes before you put them on to be sure there are no objects inside.  Do not cross your legs. This may decrease the blood flow to your feet.  If you find a minor scrape,  cut, or break in the skin on your feet, keep it and the skin around it clean and dry. These areas may be cleansed with mild soap and water. Do not cleanse the area with peroxide, alcohol, or iodine.  When you remove an adhesive bandage, be sure not to damage the skin around it.  If you have a wound, look at it several times a day to make sure it is healing.  Do not use heating pads or hot water bottles. They may burn your skin. If you have lost feeling in your feet or legs, you may not know it is happening until it is too late.  Make sure your health care provider performs a complete foot exam at least annually or more often if you have foot problems. Report any cuts, sores, or bruises to your health care provider immediately. SEEK MEDICAL CARE IF:   You have an injury that is not healing.  You have cuts or breaks in the skin.  You have an ingrown nail.  You notice redness on your legs or feet.  You feel burning or tingling in your legs or feet.  You have pain or cramps in your legs and feet.  Your legs or feet are numb.  Your feet always feel cold. SEEK IMMEDIATE MEDICAL CARE IF:   There is increasing redness,   swelling, or pain in or around a wound.  There is a red line that goes up your leg.  Pus is coming from a wound.  You develop a fever or as directed by your health care provider.  You notice a bad smell coming from an ulcer or wound. Document Released: 12/24/1999 Document Revised: 08/28/2012 Document Reviewed: 06/04/2012 Exit[employer] Patient Information 2015 [employer]. This information is not intended to replace advice given to you by your health care provider. Make sure you discuss any questions you have with your health care provider.  

## 2013-06-26 NOTE — Progress Notes (Signed)
   Subjective:    Patient ID: Anthony Le, male    DOB: 01-12-1955, 58 y.o.   MRN: [id number]  HPI I think it is doing good on my left big toe    Review of Systems no new systemic findings or changes noted     Objective:   Physical Exam Neurovascular status unchanged patient's pedal pulses palpable epicritic and proprioceptive sensations intact but diminished to the toes in particular forefoot and left hallux patient does have hemorrhage a keratoses of the left hallux IP joint and with hallux limitus and rigidus deformity present there is about to 3 mm dermal level ulcer debridement of subcutaneous tissue Silvadene and Band-Aid dressing are applied patient will maintain Silvadene for at least 4 right 4 more days and then can discontinue the ulcer is debrided this time maintain cushioned insoles he still waiting on diabetic shoes and custom insoles to be delivered having been measured and authorized. Given the ulcer site is debrided return in probably one month for followup and future palliative nail care as well as diabetic shoe fitting as needed      Assessment & Plan:  Assessment resolving ulcer left hallux secondary to hallux rigidus as well as diabetes with neuropathy. The ulcer is down to dermal level only debridement of the hemorrhage a keratotic tissue with no significant discharge or drainage abdomen noted may discontinue treatment at this time with the next day or 2 however maintain cushion shoe padding as needed followup with the next month for diabetic shoe pick up and fitting and diabetic foot and palliative nail care as needed  Harriet Masson DPM

## 2013-06-30 DIAGNOSIS — Z7901 Long term (current) use of anticoagulants: Secondary | ICD-10-CM | POA: Diagnosis not present

## 2013-07-10 ENCOUNTER — Telehealth: Payer: Self-pay | Admitting: *Deleted

## 2013-07-10 NOTE — Telephone Encounter (Signed)
If you will, give me a ring back.  Anthony Le

## 2013-07-10 NOTE — Telephone Encounter (Signed)
Called patient back and stated that his shoes are in and we were waiting on the inserts which take about a month to come in and when they do I will call the patient. Lattie Haw

## 2013-07-24 ENCOUNTER — Ambulatory Visit (INDEPENDENT_AMBULATORY_CARE_PROVIDER_SITE_OTHER): Payer: Medicare Other

## 2013-07-24 VITALS — BP 140/104 | HR 77 | Resp 18

## 2013-07-24 DIAGNOSIS — Q828 Other specified congenital malformations of skin: Secondary | ICD-10-CM

## 2013-07-24 DIAGNOSIS — L608 Other nail disorders: Secondary | ICD-10-CM

## 2013-07-24 DIAGNOSIS — E1149 Type 2 diabetes mellitus with other diabetic neurological complication: Secondary | ICD-10-CM

## 2013-07-24 DIAGNOSIS — M19079 Primary osteoarthritis, unspecified ankle and foot: Secondary | ICD-10-CM

## 2013-07-24 DIAGNOSIS — M2020 Hallux rigidus, unspecified foot: Secondary | ICD-10-CM

## 2013-07-24 DIAGNOSIS — E1140 Type 2 diabetes mellitus with diabetic neuropathy, unspecified: Secondary | ICD-10-CM

## 2013-07-24 NOTE — Progress Notes (Signed)
   Subjective:    Patient ID: Anthony Le, male    DOB: Jan 21, 1955, 58 y.o.   MRN: [id number]  HPI I am doing better on my left foot and I am here to get my diabetic shoes    Review of Systems no new findings or systemic changes noted     Objective:   Physical Exam Lower extremity objective findings as follows neurovascular status unchanged pedal pulses are palpable DP +2/4 PT plus one over 4 bilateral epicritic sensation diminished on Semmes Weinstein testing there is normal plantar response DTRs not elicited dermatologically skin color pigment normal hair growth absent nails criptotic incurvated knee debridement sometime in the next month to 2 months for followup at this time does have keratoses sub-hallux IP joint left the ulcer is resolved there is no open wound ulceration no discharge or drainage there is contracture the toe hallux limitus rigidus deformity with associated keratoses in history of ulcer at this time diabetic shoes and 3 pairs of custom molded dual density Plastizote inlays are dispensed the shoes in lace contoured to the foot with full contact to the arch in plantar foot. No other changes patient does have os arthropathy capsulitis and history of keratoses as well is dystrophic friable brittle nails. No no infections or other complications at the current time the ulcer is resolved       Assessment & Plan:  Assessment diabetes with peripheral neuropathy hallux rigidus and osteoarthropathy history of ulceration which is resolved at this point left hallux ulcer is closed this time dispensed 1 pair shoes and 3 pairs of insoles with oral and written instructions for use in break in patient previously worn diabetic shoes which are worn need replacing. Followup in 2 months for continued future palliative nail and skin care as needed. Contact us visiting changes or exacerbations her knee difficulties at any time  Harriet Masson DPM

## 2013-08-06 DIAGNOSIS — M109 Gout, unspecified: Secondary | ICD-10-CM | POA: Diagnosis not present

## 2013-08-06 DIAGNOSIS — Z7901 Long term (current) use of anticoagulants: Secondary | ICD-10-CM | POA: Diagnosis not present

## 2013-08-06 DIAGNOSIS — Z6841 Body Mass Index (BMI) 40.0 and over, adult: Secondary | ICD-10-CM | POA: Diagnosis not present

## 2013-08-06 DIAGNOSIS — E1149 Type 2 diabetes mellitus with other diabetic neurological complication: Secondary | ICD-10-CM | POA: Diagnosis not present

## 2013-08-06 DIAGNOSIS — I10 Essential (primary) hypertension: Secondary | ICD-10-CM | POA: Diagnosis not present

## 2013-08-06 DIAGNOSIS — Z79899 Other long term (current) drug therapy: Secondary | ICD-10-CM | POA: Diagnosis not present

## 2013-08-06 DIAGNOSIS — E785 Hyperlipidemia, unspecified: Secondary | ICD-10-CM | POA: Diagnosis not present

## 2013-08-13 DIAGNOSIS — R791 Abnormal coagulation profile: Secondary | ICD-10-CM | POA: Diagnosis not present

## 2013-09-12 DIAGNOSIS — Z7901 Long term (current) use of anticoagulants: Secondary | ICD-10-CM | POA: Diagnosis not present

## 2013-09-25 ENCOUNTER — Ambulatory Visit (INDEPENDENT_AMBULATORY_CARE_PROVIDER_SITE_OTHER): Payer: Medicare Other

## 2013-09-25 VITALS — BP 150/96 | HR 77 | Resp 18

## 2013-09-25 DIAGNOSIS — E1142 Type 2 diabetes mellitus with diabetic polyneuropathy: Secondary | ICD-10-CM | POA: Diagnosis not present

## 2013-09-25 DIAGNOSIS — E1149 Type 2 diabetes mellitus with other diabetic neurological complication: Secondary | ICD-10-CM

## 2013-09-25 DIAGNOSIS — E1140 Type 2 diabetes mellitus with diabetic neuropathy, unspecified: Secondary | ICD-10-CM

## 2013-09-25 DIAGNOSIS — L608 Other nail disorders: Secondary | ICD-10-CM | POA: Diagnosis not present

## 2013-09-25 DIAGNOSIS — Q828 Other specified congenital malformations of skin: Secondary | ICD-10-CM | POA: Diagnosis not present

## 2013-09-25 NOTE — Patient Instructions (Signed)
Diabetes and Foot Care Diabetes may cause you to have problems because of poor blood supply (circulation) to your feet and legs. This may cause the skin on your feet to become thinner, break easier, and heal more slowly. Your skin may become dry, and the skin may peel and crack. You may also have nerve damage in your legs and feet causing decreased feeling in them. You may not notice minor injuries to your feet that could lead to infections or more serious problems. Taking care of your feet is one of the most important things you can do for yourself.  HOME CARE INSTRUCTIONS  Wear shoes at all times, even in the house. Do not go barefoot. Bare feet are easily injured.  Check your feet daily for blisters, cuts, and redness. If you cannot see the bottom of your feet, use a mirror or ask someone for help.  Wash your feet with warm water (do not use hot water) and mild soap. Then pat your feet and the areas between your toes until they are completely dry. Do not soak your feet as this can dry your skin.  Apply a moisturizing lotion or petroleum jelly (that does not contain alcohol and is unscented) to the skin on your feet and to dry, brittle toenails. Do not apply lotion between your toes.  Trim your toenails straight across. Do not dig under them or around the cuticle. File the edges of your nails with an emery board or nail file.  Do not cut corns or calluses or try to remove them with medicine.  Wear clean socks or stockings every day. Make sure they are not too tight. Do not wear knee-high stockings since they may decrease blood flow to your legs.  Wear shoes that fit properly and have enough cushioning. To break in new shoes, wear them for just a few hours a day. This prevents you from injuring your feet. Always look in your shoes before you put them on to be sure there are no objects inside.  Do not cross your legs. This may decrease the blood flow to your feet.  If you find a minor scrape,  cut, or break in the skin on your feet, keep it and the skin around it clean and dry. These areas may be cleansed with mild soap and water. Do not cleanse the area with peroxide, alcohol, or iodine.  When you remove an adhesive bandage, be sure not to damage the skin around it.  If you have a wound, look at it several times a day to make sure it is healing.  Do not use heating pads or hot water bottles. They may burn your skin. If you have lost feeling in your feet or legs, you may not know it is happening until it is too late.  Make sure your health care provider performs a complete foot exam at least annually or more often if you have foot problems. Report any cuts, sores, or bruises to your health care provider immediately. SEEK MEDICAL CARE IF:   You have an injury that is not healing.  You have cuts or breaks in the skin.  You have an ingrown nail.  You notice redness on your legs or feet.  You feel burning or tingling in your legs or feet.  You have pain or cramps in your legs and feet.  Your legs or feet are numb.  Your feet always feel cold. SEEK IMMEDIATE MEDICAL CARE IF:   There is increasing redness,   swelling, or pain in or around a wound.  There is a red line that goes up your leg.  Pus is coming from a wound.  You develop a fever or as directed by your health care provider.  You notice a bad smell coming from an ulcer or wound. Document Released: 12/24/1999 Document Revised: 08/28/2012 Document Reviewed: 06/04/2012 Exit[employer] Patient Information 2015 [employer]. This information is not intended to replace advice given to you by your health care provider. Make sure you discuss any questions you have with your health care provider.  

## 2013-09-25 NOTE — Progress Notes (Signed)
   Subjective:    Patient ID: Anthony Le, male    DOB: 07/13/1955, 58 y.o.   MRN: [id number]  HPI I AM HERE TO GET MY TOENAILS TRIMMED UP    Review of Systems no new findings or systemic changes noted    Objective:   Physical Exam Vascular status is intact pedal pulses DP +2 PT plus one over 4 capillary refill time 3 seconds all digits decreased sensation confirmed the forefoot toes and arch. DTRs not elicited there is normal plantar response neurologically skin color pigment normal hair growth absent nails thick brittle criptotic incurvated 1 through 5 bilateral there is also a healed ulcer with hemorrhage a keratoses hallux IP joint left no active bleeding discharge or drainage is noted however keratoses debridement is also keratoses sub-fifth MTP area bilateral with no open ulcers no secondary infections no discharge or drainage noted.       Assessment & Plan:  Assessment this time diabetes history peripheral neuropathy history of ulceration this time multiple keratoses are debrided left hallux and sub-fifth bilateral also multiple dystrophic friable criptotic nails 1 through 5 bilateral debrided return for future palliative care is needed maintain appropriate shoes socks and dispensed diabetic foot care instructions.  Harriet Masson DPM

## 2013-10-13 DIAGNOSIS — Z7901 Long term (current) use of anticoagulants: Secondary | ICD-10-CM | POA: Diagnosis not present

## 2013-11-11 DIAGNOSIS — M109 Gout, unspecified: Secondary | ICD-10-CM | POA: Diagnosis not present

## 2013-11-11 DIAGNOSIS — I82409 Acute embolism and thrombosis of unspecified deep veins of unspecified lower extremity: Secondary | ICD-10-CM | POA: Diagnosis not present

## 2013-11-11 DIAGNOSIS — Z6841 Body Mass Index (BMI) 40.0 and over, adult: Secondary | ICD-10-CM | POA: Diagnosis not present

## 2013-11-11 DIAGNOSIS — Z23 Encounter for immunization: Secondary | ICD-10-CM | POA: Diagnosis not present

## 2013-11-11 DIAGNOSIS — I10 Essential (primary) hypertension: Secondary | ICD-10-CM | POA: Diagnosis not present

## 2013-11-11 DIAGNOSIS — Z79899 Other long term (current) drug therapy: Secondary | ICD-10-CM | POA: Diagnosis not present

## 2013-11-11 DIAGNOSIS — E785 Hyperlipidemia, unspecified: Secondary | ICD-10-CM | POA: Diagnosis not present

## 2013-11-11 DIAGNOSIS — E1140 Type 2 diabetes mellitus with diabetic neuropathy, unspecified: Secondary | ICD-10-CM | POA: Diagnosis not present

## 2013-11-11 DIAGNOSIS — I2692 Saddle embolus of pulmonary artery without acute cor pulmonale: Secondary | ICD-10-CM | POA: Diagnosis not present

## 2013-11-14 DIAGNOSIS — Z7901 Long term (current) use of anticoagulants: Secondary | ICD-10-CM | POA: Diagnosis not present

## 2013-11-24 DIAGNOSIS — R791 Abnormal coagulation profile: Secondary | ICD-10-CM | POA: Diagnosis not present

## 2013-12-09 DIAGNOSIS — R791 Abnormal coagulation profile: Secondary | ICD-10-CM | POA: Diagnosis not present

## 2013-12-16 DIAGNOSIS — R791 Abnormal coagulation profile: Secondary | ICD-10-CM | POA: Diagnosis not present

## 2013-12-23 DIAGNOSIS — R791 Abnormal coagulation profile: Secondary | ICD-10-CM | POA: Diagnosis not present

## 2013-12-30 DIAGNOSIS — R791 Abnormal coagulation profile: Secondary | ICD-10-CM | POA: Diagnosis not present

## 2014-01-01 DIAGNOSIS — D649 Anemia, unspecified: Secondary | ICD-10-CM | POA: Diagnosis not present

## 2014-01-01 DIAGNOSIS — Z85068 Personal history of other malignant neoplasm of small intestine: Secondary | ICD-10-CM | POA: Diagnosis not present

## 2014-01-01 DIAGNOSIS — R970 Elevated carcinoembryonic antigen [CEA]: Secondary | ICD-10-CM | POA: Diagnosis not present

## 2014-01-05 ENCOUNTER — Ambulatory Visit: Payer: Medicare Other

## 2014-01-09 DIAGNOSIS — D689 Coagulation defect, unspecified: Secondary | ICD-10-CM

## 2014-01-09 HISTORY — PX: COLONOSCOPY: SHX174

## 2014-01-09 HISTORY — DX: Coagulation defect, unspecified: D68.9

## 2014-01-12 ENCOUNTER — Ambulatory Visit: Payer: Medicare Other

## 2014-01-13 DIAGNOSIS — R791 Abnormal coagulation profile: Secondary | ICD-10-CM | POA: Diagnosis not present

## 2014-01-20 DIAGNOSIS — R791 Abnormal coagulation profile: Secondary | ICD-10-CM | POA: Diagnosis not present

## 2014-01-27 DIAGNOSIS — R791 Abnormal coagulation profile: Secondary | ICD-10-CM | POA: Diagnosis not present

## 2014-02-17 DIAGNOSIS — I82409 Acute embolism and thrombosis of unspecified deep veins of unspecified lower extremity: Secondary | ICD-10-CM | POA: Diagnosis not present

## 2014-02-17 DIAGNOSIS — E785 Hyperlipidemia, unspecified: Secondary | ICD-10-CM | POA: Diagnosis not present

## 2014-02-17 DIAGNOSIS — M109 Gout, unspecified: Secondary | ICD-10-CM | POA: Diagnosis not present

## 2014-02-17 DIAGNOSIS — Z6841 Body Mass Index (BMI) 40.0 and over, adult: Secondary | ICD-10-CM | POA: Diagnosis not present

## 2014-02-17 DIAGNOSIS — I2692 Saddle embolus of pulmonary artery without acute cor pulmonale: Secondary | ICD-10-CM | POA: Diagnosis not present

## 2014-02-17 DIAGNOSIS — I10 Essential (primary) hypertension: Secondary | ICD-10-CM | POA: Diagnosis not present

## 2014-02-17 DIAGNOSIS — B372 Candidiasis of skin and nail: Secondary | ICD-10-CM | POA: Diagnosis not present

## 2014-02-17 DIAGNOSIS — Z79899 Other long term (current) drug therapy: Secondary | ICD-10-CM | POA: Diagnosis not present

## 2014-02-17 DIAGNOSIS — Z7901 Long term (current) use of anticoagulants: Secondary | ICD-10-CM | POA: Diagnosis not present

## 2014-02-17 DIAGNOSIS — Z125 Encounter for screening for malignant neoplasm of prostate: Secondary | ICD-10-CM | POA: Diagnosis not present

## 2014-02-17 DIAGNOSIS — E1142 Type 2 diabetes mellitus with diabetic polyneuropathy: Secondary | ICD-10-CM | POA: Diagnosis not present

## 2014-02-25 DIAGNOSIS — R809 Proteinuria, unspecified: Secondary | ICD-10-CM | POA: Diagnosis not present

## 2014-02-27 DIAGNOSIS — B372 Candidiasis of skin and nail: Secondary | ICD-10-CM | POA: Diagnosis not present

## 2014-02-27 DIAGNOSIS — Z6841 Body Mass Index (BMI) 40.0 and over, adult: Secondary | ICD-10-CM | POA: Diagnosis not present

## 2014-03-23 DIAGNOSIS — Z7901 Long term (current) use of anticoagulants: Secondary | ICD-10-CM | POA: Diagnosis not present

## 2014-04-28 DIAGNOSIS — Z7901 Long term (current) use of anticoagulants: Secondary | ICD-10-CM | POA: Diagnosis not present

## 2014-05-13 DIAGNOSIS — R791 Abnormal coagulation profile: Secondary | ICD-10-CM | POA: Diagnosis not present

## 2014-05-19 DIAGNOSIS — R809 Proteinuria, unspecified: Secondary | ICD-10-CM | POA: Diagnosis not present

## 2014-05-19 DIAGNOSIS — N049 Nephrotic syndrome with unspecified morphologic changes: Secondary | ICD-10-CM | POA: Diagnosis not present

## 2014-05-21 DIAGNOSIS — Z1211 Encounter for screening for malignant neoplasm of colon: Secondary | ICD-10-CM | POA: Diagnosis not present

## 2014-05-21 DIAGNOSIS — I82409 Acute embolism and thrombosis of unspecified deep veins of unspecified lower extremity: Secondary | ICD-10-CM | POA: Diagnosis not present

## 2014-05-21 DIAGNOSIS — E785 Hyperlipidemia, unspecified: Secondary | ICD-10-CM | POA: Diagnosis not present

## 2014-05-21 DIAGNOSIS — M109 Gout, unspecified: Secondary | ICD-10-CM | POA: Diagnosis not present

## 2014-05-21 DIAGNOSIS — Z23 Encounter for immunization: Secondary | ICD-10-CM | POA: Diagnosis not present

## 2014-05-21 DIAGNOSIS — I10 Essential (primary) hypertension: Secondary | ICD-10-CM | POA: Diagnosis not present

## 2014-05-21 DIAGNOSIS — Z6841 Body Mass Index (BMI) 40.0 and over, adult: Secondary | ICD-10-CM | POA: Diagnosis not present

## 2014-05-21 DIAGNOSIS — N049 Nephrotic syndrome with unspecified morphologic changes: Secondary | ICD-10-CM | POA: Diagnosis not present

## 2014-05-21 DIAGNOSIS — Z79899 Other long term (current) drug therapy: Secondary | ICD-10-CM | POA: Diagnosis not present

## 2014-05-21 DIAGNOSIS — I2692 Saddle embolus of pulmonary artery without acute cor pulmonale: Secondary | ICD-10-CM | POA: Diagnosis not present

## 2014-05-21 DIAGNOSIS — E1142 Type 2 diabetes mellitus with diabetic polyneuropathy: Secondary | ICD-10-CM | POA: Diagnosis not present

## 2014-05-29 DIAGNOSIS — N049 Nephrotic syndrome with unspecified morphologic changes: Secondary | ICD-10-CM | POA: Diagnosis not present

## 2014-05-29 DIAGNOSIS — R809 Proteinuria, unspecified: Secondary | ICD-10-CM | POA: Diagnosis not present

## 2014-05-29 DIAGNOSIS — N400 Enlarged prostate without lower urinary tract symptoms: Secondary | ICD-10-CM | POA: Diagnosis not present

## 2014-06-15 DIAGNOSIS — Z85068 Personal history of other malignant neoplasm of small intestine: Secondary | ICD-10-CM | POA: Diagnosis not present

## 2014-06-16 DIAGNOSIS — Z7901 Long term (current) use of anticoagulants: Secondary | ICD-10-CM | POA: Diagnosis not present

## 2014-06-25 DIAGNOSIS — H109 Unspecified conjunctivitis: Secondary | ICD-10-CM | POA: Diagnosis not present

## 2014-06-25 DIAGNOSIS — J329 Chronic sinusitis, unspecified: Secondary | ICD-10-CM | POA: Diagnosis not present

## 2014-07-01 DIAGNOSIS — R809 Proteinuria, unspecified: Secondary | ICD-10-CM | POA: Diagnosis not present

## 2014-07-01 DIAGNOSIS — N049 Nephrotic syndrome with unspecified morphologic changes: Secondary | ICD-10-CM | POA: Diagnosis not present

## 2014-07-03 DIAGNOSIS — D472 Monoclonal gammopathy: Secondary | ICD-10-CM | POA: Insufficient documentation

## 2014-07-16 DIAGNOSIS — R791 Abnormal coagulation profile: Secondary | ICD-10-CM | POA: Diagnosis not present

## 2014-07-28 DIAGNOSIS — Z1211 Encounter for screening for malignant neoplasm of colon: Secondary | ICD-10-CM | POA: Diagnosis not present

## 2014-07-28 DIAGNOSIS — Z800 Family history of malignant neoplasm of digestive organs: Secondary | ICD-10-CM | POA: Diagnosis not present

## 2014-08-17 DIAGNOSIS — Z7901 Long term (current) use of anticoagulants: Secondary | ICD-10-CM | POA: Diagnosis not present

## 2014-08-27 DIAGNOSIS — E785 Hyperlipidemia, unspecified: Secondary | ICD-10-CM | POA: Diagnosis not present

## 2014-08-27 DIAGNOSIS — M109 Gout, unspecified: Secondary | ICD-10-CM | POA: Diagnosis not present

## 2014-08-27 DIAGNOSIS — I82409 Acute embolism and thrombosis of unspecified deep veins of unspecified lower extremity: Secondary | ICD-10-CM | POA: Diagnosis not present

## 2014-08-27 DIAGNOSIS — Z79899 Other long term (current) drug therapy: Secondary | ICD-10-CM | POA: Diagnosis not present

## 2014-08-27 DIAGNOSIS — J019 Acute sinusitis, unspecified: Secondary | ICD-10-CM | POA: Diagnosis not present

## 2014-08-27 DIAGNOSIS — Z6841 Body Mass Index (BMI) 40.0 and over, adult: Secondary | ICD-10-CM | POA: Diagnosis not present

## 2014-08-27 DIAGNOSIS — I2692 Saddle embolus of pulmonary artery without acute cor pulmonale: Secondary | ICD-10-CM | POA: Diagnosis not present

## 2014-08-27 DIAGNOSIS — I10 Essential (primary) hypertension: Secondary | ICD-10-CM | POA: Diagnosis not present

## 2014-08-27 DIAGNOSIS — E1142 Type 2 diabetes mellitus with diabetic polyneuropathy: Secondary | ICD-10-CM | POA: Diagnosis not present

## 2014-09-07 DIAGNOSIS — Z1211 Encounter for screening for malignant neoplasm of colon: Secondary | ICD-10-CM | POA: Diagnosis not present

## 2014-09-07 DIAGNOSIS — M1990 Unspecified osteoarthritis, unspecified site: Secondary | ICD-10-CM | POA: Diagnosis not present

## 2014-09-07 DIAGNOSIS — F329 Major depressive disorder, single episode, unspecified: Secondary | ICD-10-CM | POA: Diagnosis not present

## 2014-09-07 DIAGNOSIS — Z800 Family history of malignant neoplasm of digestive organs: Secondary | ICD-10-CM | POA: Diagnosis not present

## 2014-09-07 DIAGNOSIS — N189 Chronic kidney disease, unspecified: Secondary | ICD-10-CM | POA: Diagnosis not present

## 2014-09-07 DIAGNOSIS — Z85068 Personal history of other malignant neoplasm of small intestine: Secondary | ICD-10-CM | POA: Diagnosis not present

## 2014-09-07 DIAGNOSIS — E785 Hyperlipidemia, unspecified: Secondary | ICD-10-CM | POA: Diagnosis not present

## 2014-09-07 DIAGNOSIS — E669 Obesity, unspecified: Secondary | ICD-10-CM | POA: Diagnosis not present

## 2014-09-07 DIAGNOSIS — E119 Type 2 diabetes mellitus without complications: Secondary | ICD-10-CM | POA: Diagnosis not present

## 2014-09-07 DIAGNOSIS — Z6841 Body Mass Index (BMI) 40.0 and over, adult: Secondary | ICD-10-CM | POA: Diagnosis not present

## 2014-09-07 DIAGNOSIS — K219 Gastro-esophageal reflux disease without esophagitis: Secondary | ICD-10-CM | POA: Diagnosis not present

## 2014-09-07 DIAGNOSIS — I129 Hypertensive chronic kidney disease with stage 1 through stage 4 chronic kidney disease, or unspecified chronic kidney disease: Secondary | ICD-10-CM | POA: Diagnosis not present

## 2014-09-07 DIAGNOSIS — M109 Gout, unspecified: Secondary | ICD-10-CM | POA: Diagnosis not present

## 2014-09-07 DIAGNOSIS — K5730 Diverticulosis of large intestine without perforation or abscess without bleeding: Secondary | ICD-10-CM | POA: Diagnosis not present

## 2014-09-07 DIAGNOSIS — K648 Other hemorrhoids: Secondary | ICD-10-CM | POA: Diagnosis not present

## 2014-09-18 DIAGNOSIS — Z7901 Long term (current) use of anticoagulants: Secondary | ICD-10-CM | POA: Diagnosis not present

## 2014-10-20 DIAGNOSIS — Z7901 Long term (current) use of anticoagulants: Secondary | ICD-10-CM | POA: Diagnosis not present

## 2014-12-01 DIAGNOSIS — I10 Essential (primary) hypertension: Secondary | ICD-10-CM | POA: Diagnosis not present

## 2014-12-01 DIAGNOSIS — M109 Gout, unspecified: Secondary | ICD-10-CM | POA: Diagnosis not present

## 2014-12-01 DIAGNOSIS — Z79899 Other long term (current) drug therapy: Secondary | ICD-10-CM | POA: Diagnosis not present

## 2014-12-01 DIAGNOSIS — I82409 Acute embolism and thrombosis of unspecified deep veins of unspecified lower extremity: Secondary | ICD-10-CM | POA: Diagnosis not present

## 2014-12-01 DIAGNOSIS — I2692 Saddle embolus of pulmonary artery without acute cor pulmonale: Secondary | ICD-10-CM | POA: Diagnosis not present

## 2014-12-01 DIAGNOSIS — E669 Obesity, unspecified: Secondary | ICD-10-CM | POA: Diagnosis not present

## 2014-12-01 DIAGNOSIS — E1142 Type 2 diabetes mellitus with diabetic polyneuropathy: Secondary | ICD-10-CM | POA: Diagnosis not present

## 2014-12-01 DIAGNOSIS — E785 Hyperlipidemia, unspecified: Secondary | ICD-10-CM | POA: Diagnosis not present

## 2014-12-01 DIAGNOSIS — Z6841 Body Mass Index (BMI) 40.0 and over, adult: Secondary | ICD-10-CM | POA: Diagnosis not present

## 2014-12-01 DIAGNOSIS — Z23 Encounter for immunization: Secondary | ICD-10-CM | POA: Diagnosis not present

## 2014-12-01 DIAGNOSIS — Z7901 Long term (current) use of anticoagulants: Secondary | ICD-10-CM | POA: Diagnosis not present

## 2014-12-25 DIAGNOSIS — N049 Nephrotic syndrome with unspecified morphologic changes: Secondary | ICD-10-CM | POA: Diagnosis not present

## 2014-12-25 DIAGNOSIS — R809 Proteinuria, unspecified: Secondary | ICD-10-CM | POA: Diagnosis not present

## 2015-01-01 DIAGNOSIS — Z7901 Long term (current) use of anticoagulants: Secondary | ICD-10-CM | POA: Diagnosis not present

## 2015-02-09 DIAGNOSIS — Z7901 Long term (current) use of anticoagulants: Secondary | ICD-10-CM | POA: Diagnosis not present

## 2015-02-10 DIAGNOSIS — D472 Monoclonal gammopathy: Secondary | ICD-10-CM | POA: Diagnosis not present

## 2015-02-10 DIAGNOSIS — Z85068 Personal history of other malignant neoplasm of small intestine: Secondary | ICD-10-CM | POA: Diagnosis not present

## 2015-02-10 DIAGNOSIS — Z86711 Personal history of pulmonary embolism: Secondary | ICD-10-CM | POA: Diagnosis not present

## 2015-02-10 DIAGNOSIS — Z7901 Long term (current) use of anticoagulants: Secondary | ICD-10-CM | POA: Diagnosis not present

## 2015-03-11 DIAGNOSIS — Z6841 Body Mass Index (BMI) 40.0 and over, adult: Secondary | ICD-10-CM | POA: Diagnosis not present

## 2015-03-11 DIAGNOSIS — N481 Balanitis: Secondary | ICD-10-CM | POA: Diagnosis not present

## 2015-03-11 DIAGNOSIS — I10 Essential (primary) hypertension: Secondary | ICD-10-CM | POA: Diagnosis not present

## 2015-03-11 DIAGNOSIS — Z79899 Other long term (current) drug therapy: Secondary | ICD-10-CM | POA: Diagnosis not present

## 2015-03-11 DIAGNOSIS — Z7901 Long term (current) use of anticoagulants: Secondary | ICD-10-CM | POA: Diagnosis not present

## 2015-03-11 DIAGNOSIS — M109 Gout, unspecified: Secondary | ICD-10-CM | POA: Diagnosis not present

## 2015-03-11 DIAGNOSIS — I82409 Acute embolism and thrombosis of unspecified deep veins of unspecified lower extremity: Secondary | ICD-10-CM | POA: Diagnosis not present

## 2015-03-11 DIAGNOSIS — E785 Hyperlipidemia, unspecified: Secondary | ICD-10-CM | POA: Diagnosis not present

## 2015-03-11 DIAGNOSIS — E1142 Type 2 diabetes mellitus with diabetic polyneuropathy: Secondary | ICD-10-CM | POA: Diagnosis not present

## 2015-03-11 DIAGNOSIS — I2692 Saddle embolus of pulmonary artery without acute cor pulmonale: Secondary | ICD-10-CM | POA: Diagnosis not present

## 2015-04-21 DIAGNOSIS — N049 Nephrotic syndrome with unspecified morphologic changes: Secondary | ICD-10-CM | POA: Diagnosis not present

## 2015-04-21 DIAGNOSIS — R809 Proteinuria, unspecified: Secondary | ICD-10-CM | POA: Diagnosis not present

## 2015-05-28 DIAGNOSIS — Z7901 Long term (current) use of anticoagulants: Secondary | ICD-10-CM | POA: Diagnosis not present

## 2015-06-10 DIAGNOSIS — R791 Abnormal coagulation profile: Secondary | ICD-10-CM | POA: Diagnosis not present

## 2015-06-17 DIAGNOSIS — I2692 Saddle embolus of pulmonary artery without acute cor pulmonale: Secondary | ICD-10-CM | POA: Diagnosis not present

## 2015-06-17 DIAGNOSIS — Z6841 Body Mass Index (BMI) 40.0 and over, adult: Secondary | ICD-10-CM | POA: Diagnosis not present

## 2015-06-17 DIAGNOSIS — Z1389 Encounter for screening for other disorder: Secondary | ICD-10-CM | POA: Diagnosis not present

## 2015-06-17 DIAGNOSIS — E1142 Type 2 diabetes mellitus with diabetic polyneuropathy: Secondary | ICD-10-CM | POA: Diagnosis not present

## 2015-06-17 DIAGNOSIS — M109 Gout, unspecified: Secondary | ICD-10-CM | POA: Diagnosis not present

## 2015-06-17 DIAGNOSIS — Z79899 Other long term (current) drug therapy: Secondary | ICD-10-CM | POA: Diagnosis not present

## 2015-06-17 DIAGNOSIS — E785 Hyperlipidemia, unspecified: Secondary | ICD-10-CM | POA: Diagnosis not present

## 2015-06-17 DIAGNOSIS — I82409 Acute embolism and thrombosis of unspecified deep veins of unspecified lower extremity: Secondary | ICD-10-CM | POA: Diagnosis not present

## 2015-06-17 DIAGNOSIS — I10 Essential (primary) hypertension: Secondary | ICD-10-CM | POA: Diagnosis not present

## 2015-07-12 DIAGNOSIS — Z7901 Long term (current) use of anticoagulants: Secondary | ICD-10-CM | POA: Diagnosis not present

## 2015-07-28 DIAGNOSIS — R791 Abnormal coagulation profile: Secondary | ICD-10-CM | POA: Diagnosis not present

## 2015-08-25 DIAGNOSIS — Z85068 Personal history of other malignant neoplasm of small intestine: Secondary | ICD-10-CM | POA: Diagnosis not present

## 2015-09-01 DIAGNOSIS — Z85068 Personal history of other malignant neoplasm of small intestine: Secondary | ICD-10-CM | POA: Diagnosis not present

## 2015-09-01 DIAGNOSIS — D472 Monoclonal gammopathy: Secondary | ICD-10-CM | POA: Diagnosis not present

## 2015-09-01 DIAGNOSIS — Z7901 Long term (current) use of anticoagulants: Secondary | ICD-10-CM | POA: Diagnosis not present

## 2015-09-22 DIAGNOSIS — I2692 Saddle embolus of pulmonary artery without acute cor pulmonale: Secondary | ICD-10-CM | POA: Diagnosis not present

## 2015-09-22 DIAGNOSIS — I10 Essential (primary) hypertension: Secondary | ICD-10-CM | POA: Diagnosis not present

## 2015-09-22 DIAGNOSIS — B372 Candidiasis of skin and nail: Secondary | ICD-10-CM | POA: Diagnosis not present

## 2015-09-22 DIAGNOSIS — Z6841 Body Mass Index (BMI) 40.0 and over, adult: Secondary | ICD-10-CM | POA: Diagnosis not present

## 2015-09-22 DIAGNOSIS — E1142 Type 2 diabetes mellitus with diabetic polyneuropathy: Secondary | ICD-10-CM | POA: Diagnosis not present

## 2015-09-22 DIAGNOSIS — I82409 Acute embolism and thrombosis of unspecified deep veins of unspecified lower extremity: Secondary | ICD-10-CM | POA: Diagnosis not present

## 2015-09-22 DIAGNOSIS — E785 Hyperlipidemia, unspecified: Secondary | ICD-10-CM | POA: Diagnosis not present

## 2015-09-22 DIAGNOSIS — Z79899 Other long term (current) drug therapy: Secondary | ICD-10-CM | POA: Diagnosis not present

## 2015-09-22 DIAGNOSIS — M109 Gout, unspecified: Secondary | ICD-10-CM | POA: Diagnosis not present

## 2015-10-01 DIAGNOSIS — Z7901 Long term (current) use of anticoagulants: Secondary | ICD-10-CM | POA: Diagnosis not present

## 2015-10-19 DIAGNOSIS — N049 Nephrotic syndrome with unspecified morphologic changes: Secondary | ICD-10-CM | POA: Diagnosis not present

## 2015-10-19 DIAGNOSIS — R809 Proteinuria, unspecified: Secondary | ICD-10-CM | POA: Diagnosis not present

## 2015-11-08 DIAGNOSIS — Z7901 Long term (current) use of anticoagulants: Secondary | ICD-10-CM | POA: Diagnosis not present

## 2015-11-17 DIAGNOSIS — R791 Abnormal coagulation profile: Secondary | ICD-10-CM | POA: Diagnosis not present

## 2015-11-19 DIAGNOSIS — R791 Abnormal coagulation profile: Secondary | ICD-10-CM | POA: Diagnosis not present

## 2015-11-26 DIAGNOSIS — R791 Abnormal coagulation profile: Secondary | ICD-10-CM | POA: Diagnosis not present

## 2015-12-09 DIAGNOSIS — R791 Abnormal coagulation profile: Secondary | ICD-10-CM | POA: Diagnosis not present

## 2015-12-30 DIAGNOSIS — Z23 Encounter for immunization: Secondary | ICD-10-CM | POA: Diagnosis not present

## 2015-12-30 DIAGNOSIS — Z6841 Body Mass Index (BMI) 40.0 and over, adult: Secondary | ICD-10-CM | POA: Diagnosis not present

## 2015-12-30 DIAGNOSIS — E785 Hyperlipidemia, unspecified: Secondary | ICD-10-CM | POA: Diagnosis not present

## 2015-12-30 DIAGNOSIS — I10 Essential (primary) hypertension: Secondary | ICD-10-CM | POA: Diagnosis not present

## 2015-12-30 DIAGNOSIS — R791 Abnormal coagulation profile: Secondary | ICD-10-CM | POA: Diagnosis not present

## 2015-12-30 DIAGNOSIS — M109 Gout, unspecified: Secondary | ICD-10-CM | POA: Diagnosis not present

## 2015-12-30 DIAGNOSIS — I2692 Saddle embolus of pulmonary artery without acute cor pulmonale: Secondary | ICD-10-CM | POA: Diagnosis not present

## 2015-12-30 DIAGNOSIS — E1142 Type 2 diabetes mellitus with diabetic polyneuropathy: Secondary | ICD-10-CM | POA: Diagnosis not present

## 2015-12-30 DIAGNOSIS — N481 Balanitis: Secondary | ICD-10-CM | POA: Diagnosis not present

## 2015-12-30 DIAGNOSIS — Z79899 Other long term (current) drug therapy: Secondary | ICD-10-CM | POA: Diagnosis not present

## 2015-12-30 DIAGNOSIS — N3000 Acute cystitis without hematuria: Secondary | ICD-10-CM | POA: Diagnosis not present

## 2015-12-30 DIAGNOSIS — N471 Phimosis: Secondary | ICD-10-CM | POA: Diagnosis not present

## 2015-12-30 DIAGNOSIS — I82409 Acute embolism and thrombosis of unspecified deep veins of unspecified lower extremity: Secondary | ICD-10-CM | POA: Diagnosis not present

## 2016-01-06 DIAGNOSIS — N476 Balanoposthitis: Secondary | ICD-10-CM | POA: Diagnosis not present

## 2016-01-06 DIAGNOSIS — F419 Anxiety disorder, unspecified: Secondary | ICD-10-CM | POA: Diagnosis not present

## 2016-01-06 DIAGNOSIS — G4730 Sleep apnea, unspecified: Secondary | ICD-10-CM | POA: Diagnosis not present

## 2016-01-06 DIAGNOSIS — Z6839 Body mass index (BMI) 39.0-39.9, adult: Secondary | ICD-10-CM | POA: Diagnosis not present

## 2016-01-06 DIAGNOSIS — N480 Leukoplakia of penis: Secondary | ICD-10-CM | POA: Diagnosis not present

## 2016-01-06 DIAGNOSIS — F329 Major depressive disorder, single episode, unspecified: Secondary | ICD-10-CM | POA: Diagnosis not present

## 2016-01-06 DIAGNOSIS — N471 Phimosis: Secondary | ICD-10-CM | POA: Diagnosis not present

## 2016-01-06 DIAGNOSIS — Z87891 Personal history of nicotine dependence: Secondary | ICD-10-CM | POA: Diagnosis not present

## 2016-01-06 DIAGNOSIS — Z79899 Other long term (current) drug therapy: Secondary | ICD-10-CM | POA: Diagnosis not present

## 2016-01-06 DIAGNOSIS — E785 Hyperlipidemia, unspecified: Secondary | ICD-10-CM | POA: Diagnosis not present

## 2016-01-06 DIAGNOSIS — I10 Essential (primary) hypertension: Secondary | ICD-10-CM | POA: Diagnosis not present

## 2016-01-06 DIAGNOSIS — N3000 Acute cystitis without hematuria: Secondary | ICD-10-CM | POA: Diagnosis not present

## 2016-01-06 DIAGNOSIS — E119 Type 2 diabetes mellitus without complications: Secondary | ICD-10-CM | POA: Diagnosis not present

## 2016-01-06 DIAGNOSIS — Z01810 Encounter for preprocedural cardiovascular examination: Secondary | ICD-10-CM | POA: Diagnosis not present

## 2016-01-06 DIAGNOSIS — N481 Balanitis: Secondary | ICD-10-CM | POA: Diagnosis not present

## 2016-01-13 DIAGNOSIS — N481 Balanitis: Secondary | ICD-10-CM | POA: Diagnosis not present

## 2016-02-23 DIAGNOSIS — D649 Anemia, unspecified: Secondary | ICD-10-CM | POA: Diagnosis not present

## 2016-02-23 DIAGNOSIS — Z85068 Personal history of other malignant neoplasm of small intestine: Secondary | ICD-10-CM | POA: Diagnosis not present

## 2016-03-01 DIAGNOSIS — I10 Essential (primary) hypertension: Secondary | ICD-10-CM | POA: Diagnosis not present

## 2016-03-01 DIAGNOSIS — D472 Monoclonal gammopathy: Secondary | ICD-10-CM | POA: Diagnosis not present

## 2016-03-01 DIAGNOSIS — Z85068 Personal history of other malignant neoplasm of small intestine: Secondary | ICD-10-CM | POA: Diagnosis not present

## 2016-03-01 DIAGNOSIS — Z85038 Personal history of other malignant neoplasm of large intestine: Secondary | ICD-10-CM | POA: Diagnosis not present

## 2016-03-10 DIAGNOSIS — Z7901 Long term (current) use of anticoagulants: Secondary | ICD-10-CM | POA: Diagnosis not present

## 2016-03-30 DIAGNOSIS — R791 Abnormal coagulation profile: Secondary | ICD-10-CM | POA: Diagnosis not present

## 2016-04-05 DIAGNOSIS — Z79899 Other long term (current) drug therapy: Secondary | ICD-10-CM | POA: Diagnosis not present

## 2016-04-05 DIAGNOSIS — Z125 Encounter for screening for malignant neoplasm of prostate: Secondary | ICD-10-CM | POA: Diagnosis not present

## 2016-04-05 DIAGNOSIS — I82409 Acute embolism and thrombosis of unspecified deep veins of unspecified lower extremity: Secondary | ICD-10-CM | POA: Diagnosis not present

## 2016-04-05 DIAGNOSIS — E1142 Type 2 diabetes mellitus with diabetic polyneuropathy: Secondary | ICD-10-CM | POA: Diagnosis not present

## 2016-04-05 DIAGNOSIS — I2692 Saddle embolus of pulmonary artery without acute cor pulmonale: Secondary | ICD-10-CM | POA: Diagnosis not present

## 2016-04-05 DIAGNOSIS — Z6841 Body Mass Index (BMI) 40.0 and over, adult: Secondary | ICD-10-CM | POA: Diagnosis not present

## 2016-04-05 DIAGNOSIS — M109 Gout, unspecified: Secondary | ICD-10-CM | POA: Diagnosis not present

## 2016-04-05 DIAGNOSIS — E785 Hyperlipidemia, unspecified: Secondary | ICD-10-CM | POA: Diagnosis not present

## 2016-04-05 DIAGNOSIS — R791 Abnormal coagulation profile: Secondary | ICD-10-CM | POA: Diagnosis not present

## 2016-04-05 DIAGNOSIS — I10 Essential (primary) hypertension: Secondary | ICD-10-CM | POA: Diagnosis not present

## 2016-04-21 DIAGNOSIS — Z7901 Long term (current) use of anticoagulants: Secondary | ICD-10-CM | POA: Diagnosis not present

## 2016-05-05 DIAGNOSIS — R791 Abnormal coagulation profile: Secondary | ICD-10-CM | POA: Diagnosis not present

## 2016-05-22 DIAGNOSIS — R791 Abnormal coagulation profile: Secondary | ICD-10-CM | POA: Diagnosis not present

## 2016-05-29 DIAGNOSIS — R791 Abnormal coagulation profile: Secondary | ICD-10-CM | POA: Diagnosis not present

## 2016-06-06 DIAGNOSIS — R791 Abnormal coagulation profile: Secondary | ICD-10-CM | POA: Diagnosis not present

## 2016-06-20 DIAGNOSIS — R791 Abnormal coagulation profile: Secondary | ICD-10-CM | POA: Diagnosis not present

## 2016-06-22 DIAGNOSIS — R791 Abnormal coagulation profile: Secondary | ICD-10-CM | POA: Diagnosis not present

## 2016-07-14 DIAGNOSIS — M109 Gout, unspecified: Secondary | ICD-10-CM | POA: Diagnosis not present

## 2016-07-14 DIAGNOSIS — I82409 Acute embolism and thrombosis of unspecified deep veins of unspecified lower extremity: Secondary | ICD-10-CM | POA: Diagnosis not present

## 2016-07-14 DIAGNOSIS — Z9181 History of falling: Secondary | ICD-10-CM | POA: Diagnosis not present

## 2016-07-14 DIAGNOSIS — I2692 Saddle embolus of pulmonary artery without acute cor pulmonale: Secondary | ICD-10-CM | POA: Diagnosis not present

## 2016-07-14 DIAGNOSIS — I10 Essential (primary) hypertension: Secondary | ICD-10-CM | POA: Diagnosis not present

## 2016-07-14 DIAGNOSIS — E1142 Type 2 diabetes mellitus with diabetic polyneuropathy: Secondary | ICD-10-CM | POA: Diagnosis not present

## 2016-07-14 DIAGNOSIS — Z1389 Encounter for screening for other disorder: Secondary | ICD-10-CM | POA: Diagnosis not present

## 2016-07-14 DIAGNOSIS — Z6841 Body Mass Index (BMI) 40.0 and over, adult: Secondary | ICD-10-CM | POA: Diagnosis not present

## 2016-07-14 DIAGNOSIS — E785 Hyperlipidemia, unspecified: Secondary | ICD-10-CM | POA: Diagnosis not present

## 2016-07-14 DIAGNOSIS — Z79899 Other long term (current) drug therapy: Secondary | ICD-10-CM | POA: Diagnosis not present

## 2016-07-14 DIAGNOSIS — R791 Abnormal coagulation profile: Secondary | ICD-10-CM | POA: Diagnosis not present

## 2016-07-27 DIAGNOSIS — E1142 Type 2 diabetes mellitus with diabetic polyneuropathy: Secondary | ICD-10-CM | POA: Diagnosis not present

## 2016-09-18 DIAGNOSIS — Z85038 Personal history of other malignant neoplasm of large intestine: Secondary | ICD-10-CM | POA: Diagnosis not present

## 2016-09-18 DIAGNOSIS — D472 Monoclonal gammopathy: Secondary | ICD-10-CM | POA: Diagnosis not present

## 2016-09-18 DIAGNOSIS — I10 Essential (primary) hypertension: Secondary | ICD-10-CM | POA: Diagnosis not present

## 2016-09-25 DIAGNOSIS — I10 Essential (primary) hypertension: Secondary | ICD-10-CM | POA: Diagnosis not present

## 2016-09-25 DIAGNOSIS — D472 Monoclonal gammopathy: Secondary | ICD-10-CM | POA: Diagnosis not present

## 2016-09-25 DIAGNOSIS — Z85038 Personal history of other malignant neoplasm of large intestine: Secondary | ICD-10-CM | POA: Diagnosis not present

## 2016-09-25 DIAGNOSIS — Z23 Encounter for immunization: Secondary | ICD-10-CM | POA: Diagnosis not present

## 2016-09-25 DIAGNOSIS — M170 Bilateral primary osteoarthritis of knee: Secondary | ICD-10-CM | POA: Diagnosis not present

## 2016-11-03 DIAGNOSIS — I10 Essential (primary) hypertension: Secondary | ICD-10-CM | POA: Diagnosis not present

## 2016-11-03 DIAGNOSIS — E785 Hyperlipidemia, unspecified: Secondary | ICD-10-CM | POA: Diagnosis not present

## 2016-11-03 DIAGNOSIS — H6122 Impacted cerumen, left ear: Secondary | ICD-10-CM | POA: Diagnosis not present

## 2016-11-03 DIAGNOSIS — I82409 Acute embolism and thrombosis of unspecified deep veins of unspecified lower extremity: Secondary | ICD-10-CM | POA: Diagnosis not present

## 2016-11-03 DIAGNOSIS — Z79899 Other long term (current) drug therapy: Secondary | ICD-10-CM | POA: Diagnosis not present

## 2016-11-03 DIAGNOSIS — M109 Gout, unspecified: Secondary | ICD-10-CM | POA: Diagnosis not present

## 2016-11-03 DIAGNOSIS — E1142 Type 2 diabetes mellitus with diabetic polyneuropathy: Secondary | ICD-10-CM | POA: Diagnosis not present

## 2016-11-03 DIAGNOSIS — I2692 Saddle embolus of pulmonary artery without acute cor pulmonale: Secondary | ICD-10-CM | POA: Diagnosis not present

## 2016-11-03 DIAGNOSIS — Z6841 Body Mass Index (BMI) 40.0 and over, adult: Secondary | ICD-10-CM | POA: Diagnosis not present

## 2017-02-05 DIAGNOSIS — E1142 Type 2 diabetes mellitus with diabetic polyneuropathy: Secondary | ICD-10-CM | POA: Diagnosis not present

## 2017-02-05 DIAGNOSIS — Z6841 Body Mass Index (BMI) 40.0 and over, adult: Secondary | ICD-10-CM | POA: Diagnosis not present

## 2017-02-05 DIAGNOSIS — I10 Essential (primary) hypertension: Secondary | ICD-10-CM | POA: Diagnosis not present

## 2017-02-05 DIAGNOSIS — I82409 Acute embolism and thrombosis of unspecified deep veins of unspecified lower extremity: Secondary | ICD-10-CM | POA: Diagnosis not present

## 2017-02-05 DIAGNOSIS — M109 Gout, unspecified: Secondary | ICD-10-CM | POA: Diagnosis not present

## 2017-02-05 DIAGNOSIS — Z79899 Other long term (current) drug therapy: Secondary | ICD-10-CM | POA: Diagnosis not present

## 2017-02-05 DIAGNOSIS — I2692 Saddle embolus of pulmonary artery without acute cor pulmonale: Secondary | ICD-10-CM | POA: Diagnosis not present

## 2017-02-05 DIAGNOSIS — E785 Hyperlipidemia, unspecified: Secondary | ICD-10-CM | POA: Diagnosis not present

## 2017-02-10 DIAGNOSIS — I10 Essential (primary) hypertension: Secondary | ICD-10-CM | POA: Diagnosis not present

## 2017-02-10 DIAGNOSIS — K922 Gastrointestinal hemorrhage, unspecified: Secondary | ICD-10-CM | POA: Diagnosis not present

## 2017-02-10 DIAGNOSIS — Z7901 Long term (current) use of anticoagulants: Secondary | ICD-10-CM | POA: Diagnosis not present

## 2017-02-10 DIAGNOSIS — I2699 Other pulmonary embolism without acute cor pulmonale: Secondary | ICD-10-CM | POA: Diagnosis not present

## 2017-02-10 DIAGNOSIS — Z8679 Personal history of other diseases of the circulatory system: Secondary | ICD-10-CM | POA: Diagnosis not present

## 2017-02-10 DIAGNOSIS — D472 Monoclonal gammopathy: Secondary | ICD-10-CM | POA: Diagnosis not present

## 2017-02-11 DIAGNOSIS — Z8679 Personal history of other diseases of the circulatory system: Secondary | ICD-10-CM | POA: Diagnosis not present

## 2017-02-11 DIAGNOSIS — K922 Gastrointestinal hemorrhage, unspecified: Secondary | ICD-10-CM | POA: Diagnosis not present

## 2017-02-11 DIAGNOSIS — I2699 Other pulmonary embolism without acute cor pulmonale: Secondary | ICD-10-CM | POA: Diagnosis not present

## 2017-02-11 DIAGNOSIS — D472 Monoclonal gammopathy: Secondary | ICD-10-CM | POA: Diagnosis not present

## 2017-02-15 ENCOUNTER — Other Ambulatory Visit: Payer: Self-pay | Admitting: *Deleted

## 2017-02-15 DIAGNOSIS — Z79899 Other long term (current) drug therapy: Secondary | ICD-10-CM | POA: Diagnosis not present

## 2017-02-15 DIAGNOSIS — D472 Monoclonal gammopathy: Secondary | ICD-10-CM | POA: Diagnosis not present

## 2017-02-15 DIAGNOSIS — I2692 Saddle embolus of pulmonary artery without acute cor pulmonale: Secondary | ICD-10-CM | POA: Diagnosis not present

## 2017-02-15 DIAGNOSIS — I10 Essential (primary) hypertension: Secondary | ICD-10-CM | POA: Diagnosis not present

## 2017-02-15 DIAGNOSIS — Z6841 Body Mass Index (BMI) 40.0 and over, adult: Secondary | ICD-10-CM | POA: Diagnosis not present

## 2017-02-15 DIAGNOSIS — K5731 Diverticulosis of large intestine without perforation or abscess with bleeding: Secondary | ICD-10-CM | POA: Diagnosis not present

## 2017-02-15 DIAGNOSIS — E785 Hyperlipidemia, unspecified: Secondary | ICD-10-CM | POA: Diagnosis not present

## 2017-02-15 DIAGNOSIS — I82409 Acute embolism and thrombosis of unspecified deep veins of unspecified lower extremity: Secondary | ICD-10-CM | POA: Diagnosis not present

## 2017-02-15 DIAGNOSIS — C179 Malignant neoplasm of small intestine, unspecified: Secondary | ICD-10-CM | POA: Diagnosis not present

## 2017-02-15 NOTE — Patient Outreach (Signed)
[facility]) Care Management  02/15/2017  Anson Peddie 16-Feb-1955 [id number]  Referral via [place]; member discharged from [facility] 02/11/2017.  "TOC will be completed by primary care provider office who will refer to [facility] care management if needed."  Plan: Send to care management assistant for case closure.   Sherrin Daisy, RN BSN [place] Management Coordinator [facility] Care Management  [phone]

## 2017-02-28 DIAGNOSIS — Z136 Encounter for screening for cardiovascular disorders: Secondary | ICD-10-CM | POA: Diagnosis not present

## 2017-02-28 DIAGNOSIS — Z1331 Encounter for screening for depression: Secondary | ICD-10-CM | POA: Diagnosis not present

## 2017-02-28 DIAGNOSIS — Z6841 Body Mass Index (BMI) 40.0 and over, adult: Secondary | ICD-10-CM | POA: Diagnosis not present

## 2017-02-28 DIAGNOSIS — Z9181 History of falling: Secondary | ICD-10-CM | POA: Diagnosis not present

## 2017-02-28 DIAGNOSIS — Z0000 Encounter for general adult medical examination without abnormal findings: Secondary | ICD-10-CM | POA: Diagnosis not present

## 2017-02-28 DIAGNOSIS — Z125 Encounter for screening for malignant neoplasm of prostate: Secondary | ICD-10-CM | POA: Diagnosis not present

## 2017-02-28 DIAGNOSIS — E1142 Type 2 diabetes mellitus with diabetic polyneuropathy: Secondary | ICD-10-CM | POA: Diagnosis not present

## 2017-02-28 DIAGNOSIS — E785 Hyperlipidemia, unspecified: Secondary | ICD-10-CM | POA: Diagnosis not present

## 2017-03-22 DIAGNOSIS — Z7901 Long term (current) use of anticoagulants: Secondary | ICD-10-CM | POA: Diagnosis not present

## 2017-03-22 DIAGNOSIS — D472 Monoclonal gammopathy: Secondary | ICD-10-CM | POA: Diagnosis not present

## 2017-03-22 DIAGNOSIS — Z85068 Personal history of other malignant neoplasm of small intestine: Secondary | ICD-10-CM | POA: Diagnosis not present

## 2017-03-22 DIAGNOSIS — Z86711 Personal history of pulmonary embolism: Secondary | ICD-10-CM | POA: Diagnosis not present

## 2017-04-02 DIAGNOSIS — R635 Abnormal weight gain: Secondary | ICD-10-CM | POA: Diagnosis not present

## 2017-04-02 DIAGNOSIS — I10 Essential (primary) hypertension: Secondary | ICD-10-CM | POA: Diagnosis not present

## 2017-04-02 DIAGNOSIS — D472 Monoclonal gammopathy: Secondary | ICD-10-CM | POA: Diagnosis not present

## 2017-04-02 DIAGNOSIS — M1710 Unilateral primary osteoarthritis, unspecified knee: Secondary | ICD-10-CM | POA: Diagnosis not present

## 2017-04-02 DIAGNOSIS — R609 Edema, unspecified: Secondary | ICD-10-CM

## 2017-04-02 DIAGNOSIS — Z85068 Personal history of other malignant neoplasm of small intestine: Secondary | ICD-10-CM | POA: Diagnosis not present

## 2017-04-02 DIAGNOSIS — Z86711 Personal history of pulmonary embolism: Secondary | ICD-10-CM | POA: Diagnosis not present

## 2017-04-02 DIAGNOSIS — M13861 Other specified arthritis, right knee: Secondary | ICD-10-CM | POA: Diagnosis not present

## 2017-04-02 DIAGNOSIS — M13862 Other specified arthritis, left knee: Secondary | ICD-10-CM | POA: Diagnosis not present

## 2017-05-08 DIAGNOSIS — Z6841 Body Mass Index (BMI) 40.0 and over, adult: Secondary | ICD-10-CM | POA: Diagnosis not present

## 2017-05-08 DIAGNOSIS — I10 Essential (primary) hypertension: Secondary | ICD-10-CM | POA: Diagnosis not present

## 2017-05-08 DIAGNOSIS — M109 Gout, unspecified: Secondary | ICD-10-CM | POA: Diagnosis not present

## 2017-05-08 DIAGNOSIS — Z79899 Other long term (current) drug therapy: Secondary | ICD-10-CM | POA: Diagnosis not present

## 2017-05-08 DIAGNOSIS — Z125 Encounter for screening for malignant neoplasm of prostate: Secondary | ICD-10-CM | POA: Diagnosis not present

## 2017-05-08 DIAGNOSIS — E785 Hyperlipidemia, unspecified: Secondary | ICD-10-CM | POA: Diagnosis not present

## 2017-05-08 DIAGNOSIS — I82409 Acute embolism and thrombosis of unspecified deep veins of unspecified lower extremity: Secondary | ICD-10-CM | POA: Diagnosis not present

## 2017-05-08 DIAGNOSIS — E1142 Type 2 diabetes mellitus with diabetic polyneuropathy: Secondary | ICD-10-CM | POA: Diagnosis not present

## 2017-05-08 DIAGNOSIS — I2692 Saddle embolus of pulmonary artery without acute cor pulmonale: Secondary | ICD-10-CM | POA: Diagnosis not present

## 2017-05-10 DIAGNOSIS — R7303 Prediabetes: Secondary | ICD-10-CM | POA: Diagnosis not present

## 2017-05-10 DIAGNOSIS — H25012 Cortical age-related cataract, left eye: Secondary | ICD-10-CM | POA: Diagnosis not present

## 2017-09-20 DIAGNOSIS — D472 Monoclonal gammopathy: Secondary | ICD-10-CM | POA: Diagnosis not present

## 2017-10-02 DIAGNOSIS — N189 Chronic kidney disease, unspecified: Secondary | ICD-10-CM | POA: Diagnosis not present

## 2017-10-02 DIAGNOSIS — D472 Monoclonal gammopathy: Secondary | ICD-10-CM | POA: Diagnosis not present

## 2017-10-02 DIAGNOSIS — Z85068 Personal history of other malignant neoplasm of small intestine: Secondary | ICD-10-CM | POA: Diagnosis not present

## 2017-11-09 DIAGNOSIS — I82409 Acute embolism and thrombosis of unspecified deep veins of unspecified lower extremity: Secondary | ICD-10-CM | POA: Diagnosis not present

## 2017-11-09 DIAGNOSIS — M109 Gout, unspecified: Secondary | ICD-10-CM | POA: Diagnosis not present

## 2017-11-09 DIAGNOSIS — E785 Hyperlipidemia, unspecified: Secondary | ICD-10-CM | POA: Diagnosis not present

## 2017-11-09 DIAGNOSIS — I2692 Saddle embolus of pulmonary artery without acute cor pulmonale: Secondary | ICD-10-CM | POA: Diagnosis not present

## 2017-11-09 DIAGNOSIS — I10 Essential (primary) hypertension: Secondary | ICD-10-CM | POA: Diagnosis not present

## 2017-11-09 DIAGNOSIS — Z6841 Body Mass Index (BMI) 40.0 and over, adult: Secondary | ICD-10-CM | POA: Diagnosis not present

## 2017-11-09 DIAGNOSIS — E1142 Type 2 diabetes mellitus with diabetic polyneuropathy: Secondary | ICD-10-CM | POA: Diagnosis not present

## 2017-11-09 DIAGNOSIS — Z23 Encounter for immunization: Secondary | ICD-10-CM | POA: Diagnosis not present

## 2017-11-14 DIAGNOSIS — E1142 Type 2 diabetes mellitus with diabetic polyneuropathy: Secondary | ICD-10-CM | POA: Diagnosis not present

## 2017-11-20 ENCOUNTER — Ambulatory Visit: Payer: Medicare Other | Admitting: Podiatry

## 2017-12-11 ENCOUNTER — Ambulatory Visit (INDEPENDENT_AMBULATORY_CARE_PROVIDER_SITE_OTHER): Payer: Self-pay | Admitting: Podiatry

## 2017-12-11 DIAGNOSIS — Z5329 Procedure and treatment not carried out because of patient's decision for other reasons: Secondary | ICD-10-CM

## 2017-12-11 NOTE — Progress Notes (Signed)
Patient no-showed today's appointment.

## 2017-12-17 ENCOUNTER — Ambulatory Visit: Payer: Self-pay | Admitting: Podiatry

## 2018-05-09 DIAGNOSIS — Z136 Encounter for screening for cardiovascular disorders: Secondary | ICD-10-CM | POA: Diagnosis not present

## 2018-05-09 DIAGNOSIS — Z1339 Encounter for screening examination for other mental health and behavioral disorders: Secondary | ICD-10-CM | POA: Diagnosis not present

## 2018-05-09 DIAGNOSIS — Z23 Encounter for immunization: Secondary | ICD-10-CM | POA: Diagnosis not present

## 2018-05-09 DIAGNOSIS — Z0000 Encounter for general adult medical examination without abnormal findings: Secondary | ICD-10-CM | POA: Diagnosis not present

## 2018-05-09 DIAGNOSIS — Z6841 Body Mass Index (BMI) 40.0 and over, adult: Secondary | ICD-10-CM | POA: Diagnosis not present

## 2018-05-09 DIAGNOSIS — E785 Hyperlipidemia, unspecified: Secondary | ICD-10-CM | POA: Diagnosis not present

## 2018-05-09 DIAGNOSIS — Z125 Encounter for screening for malignant neoplasm of prostate: Secondary | ICD-10-CM | POA: Diagnosis not present

## 2018-05-09 DIAGNOSIS — E669 Obesity, unspecified: Secondary | ICD-10-CM | POA: Diagnosis not present

## 2018-05-09 DIAGNOSIS — Z1331 Encounter for screening for depression: Secondary | ICD-10-CM | POA: Diagnosis not present

## 2018-05-09 DIAGNOSIS — Z9181 History of falling: Secondary | ICD-10-CM | POA: Diagnosis not present

## 2018-05-10 DIAGNOSIS — I82409 Acute embolism and thrombosis of unspecified deep veins of unspecified lower extremity: Secondary | ICD-10-CM | POA: Diagnosis not present

## 2018-05-10 DIAGNOSIS — I2692 Saddle embolus of pulmonary artery without acute cor pulmonale: Secondary | ICD-10-CM | POA: Diagnosis not present

## 2018-05-10 DIAGNOSIS — I10 Essential (primary) hypertension: Secondary | ICD-10-CM | POA: Diagnosis not present

## 2018-05-10 DIAGNOSIS — M109 Gout, unspecified: Secondary | ICD-10-CM | POA: Diagnosis not present

## 2018-05-10 DIAGNOSIS — E785 Hyperlipidemia, unspecified: Secondary | ICD-10-CM | POA: Diagnosis not present

## 2018-05-10 DIAGNOSIS — Z6841 Body Mass Index (BMI) 40.0 and over, adult: Secondary | ICD-10-CM | POA: Diagnosis not present

## 2018-05-10 DIAGNOSIS — E1142 Type 2 diabetes mellitus with diabetic polyneuropathy: Secondary | ICD-10-CM | POA: Diagnosis not present

## 2018-05-10 DIAGNOSIS — Z125 Encounter for screening for malignant neoplasm of prostate: Secondary | ICD-10-CM | POA: Diagnosis not present

## 2018-06-06 DIAGNOSIS — N183 Chronic kidney disease, stage 3 (moderate): Secondary | ICD-10-CM | POA: Diagnosis not present

## 2018-06-06 DIAGNOSIS — D472 Monoclonal gammopathy: Secondary | ICD-10-CM | POA: Diagnosis not present

## 2018-06-06 DIAGNOSIS — Z85068 Personal history of other malignant neoplasm of small intestine: Secondary | ICD-10-CM | POA: Diagnosis not present

## 2018-06-11 DIAGNOSIS — M1A9XX0 Chronic gout, unspecified, without tophus (tophi): Secondary | ICD-10-CM | POA: Diagnosis not present

## 2018-06-14 DIAGNOSIS — I10 Essential (primary) hypertension: Secondary | ICD-10-CM | POA: Diagnosis not present

## 2018-06-14 DIAGNOSIS — H6122 Impacted cerumen, left ear: Secondary | ICD-10-CM | POA: Diagnosis not present

## 2018-11-11 DIAGNOSIS — I10 Essential (primary) hypertension: Secondary | ICD-10-CM | POA: Diagnosis not present

## 2018-11-11 DIAGNOSIS — E1142 Type 2 diabetes mellitus with diabetic polyneuropathy: Secondary | ICD-10-CM | POA: Diagnosis not present

## 2018-11-11 DIAGNOSIS — Z86718 Personal history of other venous thrombosis and embolism: Secondary | ICD-10-CM | POA: Diagnosis not present

## 2018-11-11 DIAGNOSIS — Z23 Encounter for immunization: Secondary | ICD-10-CM | POA: Diagnosis not present

## 2018-11-11 DIAGNOSIS — Z6841 Body Mass Index (BMI) 40.0 and over, adult: Secondary | ICD-10-CM | POA: Diagnosis not present

## 2018-11-11 DIAGNOSIS — M1A9XX0 Chronic gout, unspecified, without tophus (tophi): Secondary | ICD-10-CM | POA: Diagnosis not present

## 2018-11-11 DIAGNOSIS — H6123 Impacted cerumen, bilateral: Secondary | ICD-10-CM | POA: Diagnosis not present

## 2018-11-11 DIAGNOSIS — E785 Hyperlipidemia, unspecified: Secondary | ICD-10-CM | POA: Diagnosis not present

## 2018-12-02 DIAGNOSIS — D649 Anemia, unspecified: Secondary | ICD-10-CM | POA: Diagnosis not present

## 2018-12-02 DIAGNOSIS — Z0001 Encounter for general adult medical examination with abnormal findings: Secondary | ICD-10-CM | POA: Diagnosis not present

## 2018-12-19 DIAGNOSIS — Z85068 Personal history of other malignant neoplasm of small intestine: Secondary | ICD-10-CM | POA: Diagnosis not present

## 2019-02-11 DIAGNOSIS — R6889 Other general symptoms and signs: Secondary | ICD-10-CM | POA: Diagnosis not present

## 2019-02-11 DIAGNOSIS — Z20822 Contact with and (suspected) exposure to covid-19: Secondary | ICD-10-CM | POA: Diagnosis not present

## 2019-04-18 DIAGNOSIS — N189 Chronic kidney disease, unspecified: Secondary | ICD-10-CM | POA: Diagnosis not present

## 2019-04-18 DIAGNOSIS — Z85068 Personal history of other malignant neoplasm of small intestine: Secondary | ICD-10-CM | POA: Diagnosis not present

## 2019-04-18 DIAGNOSIS — Z08 Encounter for follow-up examination after completed treatment for malignant neoplasm: Secondary | ICD-10-CM | POA: Diagnosis not present

## 2019-04-18 DIAGNOSIS — D472 Monoclonal gammopathy: Secondary | ICD-10-CM | POA: Diagnosis not present

## 2019-05-12 DIAGNOSIS — E1142 Type 2 diabetes mellitus with diabetic polyneuropathy: Secondary | ICD-10-CM | POA: Diagnosis not present

## 2019-05-12 DIAGNOSIS — Z1211 Encounter for screening for malignant neoplasm of colon: Secondary | ICD-10-CM | POA: Diagnosis not present

## 2019-05-12 DIAGNOSIS — Z125 Encounter for screening for malignant neoplasm of prostate: Secondary | ICD-10-CM | POA: Diagnosis not present

## 2019-05-12 DIAGNOSIS — M1A9XX0 Chronic gout, unspecified, without tophus (tophi): Secondary | ICD-10-CM | POA: Diagnosis not present

## 2019-05-12 DIAGNOSIS — I10 Essential (primary) hypertension: Secondary | ICD-10-CM | POA: Diagnosis not present

## 2019-05-12 DIAGNOSIS — E785 Hyperlipidemia, unspecified: Secondary | ICD-10-CM | POA: Diagnosis not present

## 2019-05-12 DIAGNOSIS — N183 Chronic kidney disease, stage 3 unspecified: Secondary | ICD-10-CM | POA: Diagnosis not present

## 2019-05-12 DIAGNOSIS — H6123 Impacted cerumen, bilateral: Secondary | ICD-10-CM | POA: Diagnosis not present

## 2019-05-12 DIAGNOSIS — N049 Nephrotic syndrome with unspecified morphologic changes: Secondary | ICD-10-CM | POA: Diagnosis not present

## 2019-06-12 DIAGNOSIS — E785 Hyperlipidemia, unspecified: Secondary | ICD-10-CM | POA: Diagnosis not present

## 2019-06-12 DIAGNOSIS — Z9181 History of falling: Secondary | ICD-10-CM | POA: Diagnosis not present

## 2019-06-12 DIAGNOSIS — Z1331 Encounter for screening for depression: Secondary | ICD-10-CM | POA: Diagnosis not present

## 2019-06-12 DIAGNOSIS — Z6841 Body Mass Index (BMI) 40.0 and over, adult: Secondary | ICD-10-CM | POA: Diagnosis not present

## 2019-06-12 DIAGNOSIS — Z0000 Encounter for general adult medical examination without abnormal findings: Secondary | ICD-10-CM | POA: Diagnosis not present

## 2019-06-26 ENCOUNTER — Encounter: Payer: Self-pay | Admitting: Gastroenterology

## 2019-07-03 DIAGNOSIS — R7303 Prediabetes: Secondary | ICD-10-CM | POA: Diagnosis not present

## 2019-07-03 DIAGNOSIS — H524 Presbyopia: Secondary | ICD-10-CM | POA: Diagnosis not present

## 2019-07-03 DIAGNOSIS — H25813 Combined forms of age-related cataract, bilateral: Secondary | ICD-10-CM | POA: Diagnosis not present

## 2019-07-09 ENCOUNTER — Encounter: Payer: Self-pay | Admitting: Oncology

## 2019-07-09 DIAGNOSIS — N1831 Chronic kidney disease, stage 3a: Secondary | ICD-10-CM | POA: Diagnosis not present

## 2019-07-09 DIAGNOSIS — C179 Malignant neoplasm of small intestine, unspecified: Secondary | ICD-10-CM | POA: Diagnosis not present

## 2019-07-09 DIAGNOSIS — D472 Monoclonal gammopathy: Secondary | ICD-10-CM | POA: Diagnosis not present

## 2019-07-09 DIAGNOSIS — N189 Chronic kidney disease, unspecified: Secondary | ICD-10-CM | POA: Diagnosis not present

## 2019-07-09 DIAGNOSIS — N2581 Secondary hyperparathyroidism of renal origin: Secondary | ICD-10-CM | POA: Diagnosis not present

## 2019-07-09 DIAGNOSIS — E1122 Type 2 diabetes mellitus with diabetic chronic kidney disease: Secondary | ICD-10-CM | POA: Diagnosis not present

## 2019-07-09 DIAGNOSIS — I2699 Other pulmonary embolism without acute cor pulmonale: Secondary | ICD-10-CM | POA: Diagnosis not present

## 2019-07-09 DIAGNOSIS — D631 Anemia in chronic kidney disease: Secondary | ICD-10-CM | POA: Diagnosis not present

## 2019-07-21 ENCOUNTER — Telehealth: Payer: Self-pay | Admitting: Hematology and Oncology

## 2019-07-21 NOTE — Telephone Encounter (Signed)
Received a new hem referral from [facility] for +m spike. Mr. Anthony Le returned my call and has been sheduled to see Dr. Lorenso Courier on 7/15 at 2pm. Pt aware to arrive 15 minutes early.

## 2019-07-23 NOTE — Progress Notes (Signed)
[place] [phone]   Fax:([phone] [place] NOTE  Patient Care Team: Nicoletta Dress, MD as PCP - General (Internal Medicine)  Hematological/Oncological History # Monoclonal Gammopathy  1) 07/09/2019: SPEP showed M protein 0.2, Creatinine 1.59, Hgb 13.5 2) 07/24/2019: establish care with Dr. Lorenso Courier   CHIEF COMPLAINTS/PURPOSE OF CONSULTATION:  "M spike on SPEP "  HISTORY OF PRESENTING ILLNESS:  Anthony Le 65 y.o. male with medical history significant for DM type II, HLD, HTN, and gout who presents for evaluation of a newly diagnosed monoclonal gammopathy.   On review of the previous records Anthony Le was evaluated by his primary care provider on 07/09/2019 at which time he was found to have an M protein of 0.2 on SPEP, creatinine of 1.59, and hemoglobin of 13.5.  Due to concern for this monoclonal gammopathy the patient was referred to hematology for further evaluation and management.  On exam today Anthony Le notes that he has "trouble with his kidneys" and that he has been peeing a lot and attempting to drink more fluid to catch up.  He notes that he has protein in his urine "as long as I can remember".  He reports that he does have a borderline hemoglobin A1c.  Symptomatically though he notes that he has good energy and that his weight has been stable.  He denies having any issues with bleeding, bruising, or dark stools.  He also notes that he is not had any trouble with fevers, chills, sweats, nausea, vomiting or diarrhea.  A full 10 point ROS is listed below.  MEDICAL HISTORY:  Past Medical History:  Diagnosis Date  . Allergy    sinus problems  . Anemia   . Clotting disorder   . Depression   . Diabetes mellitus without complication   . Diverticula, colon   . Fatigue   . Gout    history of gout  . Hyperlipidemia   . Hypertension   . Memory loss   . Muscle pain    and cramping  . Osteoporosis     SURGICAL  HISTORY: Past Surgical History:  Procedure Laterality Date  . bowel removal    . CARPAL TUNNEL WITH CUBITAL TUNNEL    . left arm surgery    . LEG SURGERY    . NOSE SURGERY      SOCIAL HISTORY: Social History   Socioeconomic History  . Marital status: Legally Separated    Spouse name: Not on file  . Number of children: Not on file  . Years of education: Not on file  . Highest education level: Not on file  Occupational History  . Not on file  Tobacco Use  . Smoking status: Never Smoker  . Smokeless tobacco: Never Used  Substance and Sexual Activity  . Alcohol use: No  . Drug use: No  . Sexual activity: Not on file  Other Topics Concern  . Not on file  Social History Narrative  . Not on file   Social Determinants of Health   Financial Resource Strain:   . Difficulty of Paying Living Expenses:   Food Insecurity:   . Worried About Charity fundraiser in the Last Year:   . Arboriculturist in the Last Year:   Transportation Needs:   . Film/video editor (Medical):   Marland Kitchen Lack of Transportation (Non-Medical):   Physical Activity:   . Days of Exercise per Week:   . Minutes of Exercise per Session:  Stress:   . Feeling of Stress :   Social Connections:   . Frequency of Communication with Friends and Family:   . Frequency of Social Gatherings with Friends and Family:   . Attends Religious Services:   . Active Member of Clubs or Organizations:   . Attends Archivist Meetings:   Marland Kitchen Marital Status:   Intimate Partner Violence:   . Fear of Current or Ex-Partner:   . Emotionally Abused:   Marland Kitchen Physically Abused:   . Sexually Abused:     FAMILY HISTORY: Family History  Problem Relation Age of Onset  . Stroke Mother   . Arthritis Mother   . Diabetes Mother   . Hypertension Mother   . Cancer Father   . Stroke Father   . Hypertension Father   . Gout Father     ALLERGIES:  has No Known Allergies.  MEDICATIONS:  Current Outpatient Medications   Medication Sig Dispense Refill  . allopurinol (ZYLOPRIM) 300 MG tablet Take 300 mg by mouth daily.    . furosemide (LASIX) 40 MG tablet Take by mouth.    . metoprolol succinate (TOPROL-XL) 50 MG 24 hr tablet Take 50 mg by mouth daily.    . Omega-3 1000 MG CAPS Take 1 g by mouth.    . rosuvastatin (CRESTOR) 40 MG tablet Take 40 mg by mouth daily.    Marland Kitchen DIOVAN 320 MG tablet     . Febuxostat (ULORIC) 80 MG TABS Take 80 mg by mouth.    Marland Kitchen lisinopril (PRINIVIL,ZESTRIL) 40 MG tablet Take 40 mg by mouth.    . silver sulfADIAZINE (SILVADENE) 1 % cream Apply 1 application topically daily. 50 g 2   No current facility-administered medications for this visit.    REVIEW OF SYSTEMS:   Constitutional: ( - ) fevers, ( - )  chills , ( - ) night sweats Eyes: ( - ) blurriness of vision, ( - ) double vision, ( - ) watery eyes Ears, nose, mouth, throat, and face: ( - ) mucositis, ( - ) sore throat Respiratory: ( - ) cough, ( - ) dyspnea, ( - ) wheezes Cardiovascular: ( - ) palpitation, ( - ) chest discomfort, ( - ) lower extremity swelling Gastrointestinal:  ( - ) nausea, ( - ) heartburn, ( - ) change in bowel habits Skin: ( - ) abnormal skin rashes Lymphatics: ( - ) new lymphadenopathy, ( - ) easy bruising Neurological: ( - ) numbness, ( - ) tingling, ( - ) new weaknesses Behavioral/Psych: ( - ) mood change, ( - ) new changes  All other systems were reviewed with the patient and are negative.  PHYSICAL EXAMINATION: ECOG PERFORMANCE STATUS: 0 - Asymptomatic  Vitals:   07/24/19 1423  BP: 118/85  Pulse: 92  Resp: 18  Temp: 97.7 F (36.5 C)  SpO2: 99%   Filed Weights   07/24/19 1423  Weight: (!) 315 lb 9.6 oz (143.2 kg)    GENERAL: well appearing middle aged [place] American male in NAD  SKIN: skin color, texture, turgor are normal, no rashes or significant lesions EYES: conjunctiva are pink and non-injected, sclera clear LUNGS: clear to auscultation and percussion with normal breathing  effort HEART: regular rate & rhythm and no murmurs and no lower extremity edema Musculoskeletal: no cyanosis of digits and no clubbing  PSYCH: alert & oriented x 3, fluent speech NEURO: no focal motor/sensory deficits  LABORATORY DATA:  I have reviewed the data as listed CBC Latest Ref  Rng & Units 07/24/2019  WBC 4.0 - 10.5 K/uL 7.8  Hemoglobin 13.0 - 17.0 g/dL 15.0  Hematocrit 39 - 52 % 43.2  Platelets 150 - 400 K/uL 213    CMP Latest Ref Rng & Units 07/24/2019  Glucose 70 - 99 mg/dL 103(H)  BUN 8 - 23 mg/dL 28(H)  Creatinine 0.61 - 1.24 mg/dL 1.82(H)  Sodium 135 - 145 mmol/L 140  Potassium 3.5 - 5.1 mmol/L 4.6  Chloride 98 - 111 mmol/L 107  CO2 22 - 32 mmol/L 23  Calcium 8.9 - 10.3 mg/dL 9.7  Total Protein 6.5 - 8.1 g/dL 8.1  Total Bilirubin 0.3 - 1.2 mg/dL 0.7  Alkaline Phos 38 - 126 U/L 74  AST 15 - 41 U/L 29  ALT 0 - 44 U/L 26    RADIOGRAPHIC STUDIES: I have personally reviewed the radiological images as listed and agreed with the findings in the report. No results found.  ASSESSMENT & PLAN Anthony Le 64 y.o. male with medical history significant for DM type II, HLD, HTN, and gout who presents for evaluation of a newly diagnosed monoclonal gammopathy.  After review the labs, discussion with the patient, and review the outside records his findings are most consistent with a mild elevation M protein consistent with a monoclonal gammopathy.  In order to determine the extent of this finding I would recommend that we complete his multiple myeloma work-up with an SPEP, serum free light chains, beta-2 glycoprotein, LDH, and a urine protein electrophoresis.  Additionally we will order a DG met survey to assess for lytic lesions in the skeleton.  Pending the work-up above we can consider a bone marrow biopsy, though there is no acute indication based on the current information.  We will plan to have the patient return in approximately 6 months time for reevaluation.  #  Monoclonal Gammopathy  --we will completed the workup with SPEP, SFLC, beta 2 glycoprotein, LDH, and a UPEP --additionally will order a DG met survey to assess for lytic lesions --no clear indication for a bone marrow biopsy at this time, though this may change pending the above labs/imaging --follow up in 6 months time or sooner if bone marrow biopsy is required.   Orders Placed This Encounter  Procedures  . DG Bone Survey Met    Standing Status:   Future    Standing Expiration Date:   07/23/2020    Order Specific Question:   Reason for Exam (SYMPTOM  OR DIAGNOSIS REQUIRED)    Answer:   MGUS assess for lytic lesions    Order Specific Question:   Preferred imaging location?    Answer:   [facility]    Order Specific Question:   Radiology Contrast Protocol - do NOT remove file path    Answer:   [email or web address]  . CBC with Differential ([facility] Only)    Standing Status:   Future    Number of Occurrences:   1    Standing Expiration Date:   07/23/2020  . CMP ([place] [place] only)    Standing Status:   Future    Number of Occurrences:   1    Standing Expiration Date:   07/23/2020  . Lactate dehydrogenase (LDH)    Standing Status:   Future    Number of Occurrences:   1    Standing Expiration Date:   07/23/2020  . Multiple Myeloma Panel (SPEP&IFE w/QIG)    Standing Status:   Future    Number of Occurrences:  1    Standing Expiration Date:   07/23/2020  . Kappa/lambda light chains    Standing Status:   Future    Number of Occurrences:   1    Standing Expiration Date:   07/23/2020  . Beta 2 microglobulin    Standing Status:   Future    Number of Occurrences:   1    Standing Expiration Date:   07/23/2020  . 24-Hr Ur UPEP/UIFE/Light Chains/TP    Standing Status:   Future    Standing Expiration Date:   07/23/2020    All questions were answered. The patient knows to call the clinic with any problems, questions or concerns.  A total  of more than 45 minutes were spent on this encounter and over half of that time was spent on counseling and coordination of care as outlined above.   [name], MD Department of Hematology/Oncology [place] [facility] Phone: [phone] Pager: [phone] Email: [email or web address]  07/29/2019 3:22 PM    Literature Support:  1) Kyle RA, Durie BG, Rajkumar SV, et al. Monoclonal gammopathy of undetermined significance (MGUS) and smoldering (asymptomatic) multiple myeloma: IMWG consensus perspectives risk factors for progression and guidelines for monitoring and management. Leukemia. 2010;24(6):1121-1127. doi:10.1038/leu.2010.60   --If a patient with apparent MGUS has a serum monoclonal protein >15 g/l, IgA or IgM protein type, or an abnormal FLC ratio, a BM aspirate and biopsy should be carried out at baseline to rule out underlying PC malignancy.   2) Marylyn Ishihara RA, Therneau TM, Rajkumar SV, et al. Prevalence of monoclonal gammopathy of undetermined significance. N Engl J Med 2111;552:0802-2336   --MGUS was found in 3.2 percent of persons 6 years of age or older and 5.3 percent of persons 31 years of age or older.

## 2019-07-24 ENCOUNTER — Other Ambulatory Visit: Payer: Self-pay

## 2019-07-24 ENCOUNTER — Inpatient Hospital Stay: Payer: PPO | Attending: Hematology and Oncology | Admitting: Hematology and Oncology

## 2019-07-24 ENCOUNTER — Inpatient Hospital Stay: Payer: PPO

## 2019-07-24 VITALS — BP 118/85 | HR 92 | Temp 97.7°F | Resp 18 | Ht 74.0 in | Wt 315.6 lb

## 2019-07-24 DIAGNOSIS — E119 Type 2 diabetes mellitus without complications: Secondary | ICD-10-CM | POA: Diagnosis not present

## 2019-07-24 DIAGNOSIS — I10 Essential (primary) hypertension: Secondary | ICD-10-CM | POA: Insufficient documentation

## 2019-07-24 DIAGNOSIS — D472 Monoclonal gammopathy: Secondary | ICD-10-CM

## 2019-07-24 DIAGNOSIS — E785 Hyperlipidemia, unspecified: Secondary | ICD-10-CM | POA: Diagnosis not present

## 2019-07-24 DIAGNOSIS — Z79899 Other long term (current) drug therapy: Secondary | ICD-10-CM | POA: Insufficient documentation

## 2019-07-24 LAB — CBC WITH DIFFERENTIAL (CANCER CENTER ONLY)
Abs Immature Granulocytes: 0.02 10*3/uL (ref 0.00–0.07)
Basophils Absolute: 0.1 10*3/uL (ref 0.0–0.1)
Basophils Relative: 1 %
Eosinophils Absolute: 0.1 10*3/uL (ref 0.0–0.5)
Eosinophils Relative: 1 %
HCT: 43.2 % (ref 39.0–52.0)
Hemoglobin: 15 g/dL (ref 13.0–17.0)
Immature Granulocytes: 0 %
Lymphocytes Relative: 26 %
Lymphs Abs: 2 10*3/uL (ref 0.7–4.0)
MCH: 26.9 pg (ref 26.0–34.0)
MCHC: 34.7 g/dL (ref 30.0–36.0)
MCV: 77.4 fL — ABNORMAL LOW (ref 80.0–100.0)
Monocytes Absolute: 0.7 10*3/uL (ref 0.1–1.0)
Monocytes Relative: 9 %
Neutro Abs: 4.9 10*3/uL (ref 1.7–7.7)
Neutrophils Relative %: 63 %
Platelet Count: 213 10*3/uL (ref 150–400)
RBC: 5.58 MIL/uL (ref 4.22–5.81)
RDW: 15.9 % — ABNORMAL HIGH (ref 11.5–15.5)
WBC Count: 7.8 10*3/uL (ref 4.0–10.5)
nRBC: 0 % (ref 0.0–0.2)

## 2019-07-24 LAB — CMP (CANCER CENTER ONLY)
ALT: 26 U/L (ref 0–44)
AST: 29 U/L (ref 15–41)
Albumin: 4.1 g/dL (ref 3.5–5.0)
Alkaline Phosphatase: 74 U/L (ref 38–126)
Anion gap: 10 (ref 5–15)
BUN: 28 mg/dL — ABNORMAL HIGH (ref 8–23)
CO2: 23 mmol/L (ref 22–32)
Calcium: 9.7 mg/dL (ref 8.9–10.3)
Chloride: 107 mmol/L (ref 98–111)
Creatinine: 1.82 mg/dL — ABNORMAL HIGH (ref 0.61–1.24)
GFR, Est AFR Am: 44 mL/min — ABNORMAL LOW (ref >60)
GFR, Estimated: 38 mL/min — ABNORMAL LOW (ref >60)
Glucose, Bld: 103 mg/dL — ABNORMAL HIGH (ref 70–99)
Potassium: 4.6 mmol/L (ref 3.5–5.1)
Sodium: 140 mmol/L (ref 135–145)
Total Bilirubin: 0.7 mg/dL (ref 0.3–1.2)
Total Protein: 8.1 g/dL (ref 6.5–8.1)

## 2019-07-24 LAB — LACTATE DEHYDROGENASE: LDH: 183 U/L (ref 98–192)

## 2019-07-25 LAB — KAPPA/LAMBDA LIGHT CHAINS
Kappa free light chain: 108.2 mg/L — ABNORMAL HIGH (ref 3.3–19.4)
Kappa, lambda light chain ratio: 3 — ABNORMAL HIGH (ref 0.26–1.65)
Lambda free light chains: 36.1 mg/L — ABNORMAL HIGH (ref 5.7–26.3)

## 2019-07-25 LAB — BETA 2 MICROGLOBULIN, SERUM: Beta-2 Microglobulin: 2.5 mg/L — ABNORMAL HIGH (ref 0.6–2.4)

## 2019-07-28 ENCOUNTER — Telehealth: Payer: Self-pay | Admitting: Hematology and Oncology

## 2019-07-28 LAB — MULTIPLE MYELOMA PANEL, SERUM
Albumin SerPl Elph-Mcnc: 3.8 g/dL (ref 2.9–4.4)
Albumin/Glob SerPl: 1.2 (ref 0.7–1.7)
Alpha 1: 0.2 g/dL (ref 0.0–0.4)
Alpha2 Glob SerPl Elph-Mcnc: 0.8 g/dL (ref 0.4–1.0)
B-Globulin SerPl Elph-Mcnc: 1.1 g/dL (ref 0.7–1.3)
Gamma Glob SerPl Elph-Mcnc: 1.2 g/dL (ref 0.4–1.8)
Globulin, Total: 3.3 g/dL (ref 2.2–3.9)
IgA: 586 mg/dL — ABNORMAL HIGH (ref 61–437)
IgG (Immunoglobin G), Serum: 1061 mg/dL (ref 603–1613)
IgM (Immunoglobulin M), Srm: 45 mg/dL (ref 20–172)
M Protein SerPl Elph-Mcnc: 0.2 g/dL — ABNORMAL HIGH
Total Protein ELP: 7.1 g/dL (ref 6.0–8.5)

## 2019-07-28 NOTE — Telephone Encounter (Signed)
Called Anthony Le to discuss the results of his blood work from last week.  Results show an IgA monoclonal protein elevated at 0.2 as well as a serum free light chain ratio of 3.0.  Given these findings he meets 2 of the criteria for high risk MGUS and therefore would require bone marrow biopsy in order to rule out multiple myeloma.  We will plan to have the patient scheduled for a bone marrow biopsy within the next 1 to 2 weeks in order to help confirm the diagnosis.  [name], MD Department of Hematology/Oncology [place] at [facility] Phone: [phone] Pager: [phone] Email: [email or web address]

## 2019-07-29 ENCOUNTER — Encounter: Payer: Self-pay | Admitting: Hematology and Oncology

## 2019-07-29 ENCOUNTER — Telehealth: Payer: Self-pay

## 2019-07-29 ENCOUNTER — Telehealth: Payer: Self-pay | Admitting: Hematology and Oncology

## 2019-07-29 NOTE — Telephone Encounter (Signed)
Pt was scheduled for BMBX per Dr Lorenso Courier and Wilber Bihari, NP. Pt's daughter made aware of appts and verbalized understanding. Flowtometry made aware of bx, and message was sent to scheduling to get pt on scheduled for bx & labs. Wilber Bihari, NP made aware.

## 2019-07-29 NOTE — Telephone Encounter (Signed)
Scheduled per los. Called and left msg. Mailed printout  °

## 2019-07-31 ENCOUNTER — Other Ambulatory Visit: Payer: Self-pay

## 2019-07-31 ENCOUNTER — Ambulatory Visit (HOSPITAL_COMMUNITY)
Admission: RE | Admit: 2019-07-31 | Discharge: 2019-07-31 | Disposition: A | Discharge status: Home / Self Care | Payer: PPO | Source: Ambulatory Visit | Attending: Hematology and Oncology | Admitting: Hematology and Oncology

## 2019-07-31 DIAGNOSIS — D472 Monoclonal gammopathy: Secondary | ICD-10-CM | POA: Diagnosis not present

## 2019-08-04 LAB — UPEP/UIFE/LIGHT CHAINS/TP, 24-HR UR
% BETA, Urine: 5.5 %
ALPHA 1 URINE: 0.9 %
Albumin, U: 85.3 %
Alpha 2, Urine: 2.6 %
Free Kappa Lt Chains,Ur: 27.75 mg/L (ref 0.63–113.79)
Free Kappa/Lambda Ratio: 4.94 (ref 1.03–31.76)
Free Lambda Lt Chains,Ur: 5.62 mg/L (ref 0.47–11.77)
GAMMA GLOBULIN URINE: 5.6 %
Total Protein, Urine-Ur/day: 5298 mg/24 hr — ABNORMAL HIGH (ref 30–150)
Total Protein, Urine: 182.7 mg/dL
Total Volume: 2900

## 2019-08-07 ENCOUNTER — Inpatient Hospital Stay: Payer: PPO

## 2019-08-07 ENCOUNTER — Other Ambulatory Visit: Payer: Self-pay

## 2019-08-07 ENCOUNTER — Inpatient Hospital Stay (HOSPITAL_BASED_OUTPATIENT_CLINIC_OR_DEPARTMENT_OTHER): Payer: PPO | Admitting: Adult Health

## 2019-08-07 VITALS — BP 130/90 | HR 75 | Temp 97.7°F | Resp 18 | Ht 74.0 in | Wt 316.0 lb

## 2019-08-07 DIAGNOSIS — D472 Monoclonal gammopathy: Secondary | ICD-10-CM | POA: Diagnosis not present

## 2019-08-07 DIAGNOSIS — D72822 Plasmacytosis: Secondary | ICD-10-CM | POA: Diagnosis not present

## 2019-08-07 LAB — CBC WITH DIFFERENTIAL (CANCER CENTER ONLY)
Abs Immature Granulocytes: 0.03 10*3/uL (ref 0.00–0.07)
Basophils Absolute: 0.1 10*3/uL (ref 0.0–0.1)
Basophils Relative: 1 %
Eosinophils Absolute: 0.1 10*3/uL (ref 0.0–0.5)
Eosinophils Relative: 2 %
HCT: 40.3 % (ref 39.0–52.0)
Hemoglobin: 13.7 g/dL (ref 13.0–17.0)
Immature Granulocytes: 0 %
Lymphocytes Relative: 21 %
Lymphs Abs: 1.8 10*3/uL (ref 0.7–4.0)
MCH: 26.8 pg (ref 26.0–34.0)
MCHC: 34 g/dL (ref 30.0–36.0)
MCV: 78.7 fL — ABNORMAL LOW (ref 80.0–100.0)
Monocytes Absolute: 0.7 10*3/uL (ref 0.1–1.0)
Monocytes Relative: 9 %
Neutro Abs: 5.6 10*3/uL (ref 1.7–7.7)
Neutrophils Relative %: 67 %
Platelet Count: 185 10*3/uL (ref 150–400)
RBC: 5.12 MIL/uL (ref 4.22–5.81)
RDW: 15.9 % — ABNORMAL HIGH (ref 11.5–15.5)
WBC Count: 8.3 10*3/uL (ref 4.0–10.5)
nRBC: 0 % (ref 0.0–0.2)

## 2019-08-07 NOTE — Progress Notes (Signed)
INDICATION: MGUS  Brief examination was performed. ENT: adequate airway clearance Heart: regular rate and rhythm.No Murmurs Lungs: clear to auscultation, no wheezes, normal respiratory effort  Bone Marrow Biopsy and Aspiration Procedure Note   Informed consent was obtained and potential risks including bleeding, infection and pain were reviewed with the patient.  The patient's name, date of birth, identification, consent and allergies were verified prior to the start of procedure and time out was performed.  The left posterior iliac crest was chosen as the site of biopsy.  The skin was prepped with ChloraPrep.   16 cc of 2% lidocaine was used to provide local anaesthesia.   10 cc of bone marrow aspirate was obtained followed by 1cm biopsy.  Pressure was applied to the biopsy site and bandage was placed over the biopsy site. Patient was made to lie on the back for 30 mins prior to discharge.  The procedure was tolerated well. COMPLICATIONS: None BLOOD LOSS: none The patient was discharged home in stable condition and will f/u with Dr. Lorenso Courier to review results.  Patient was provided with post bone marrow biopsy instructions and instructed to call if there was any bleeding or worsening pain.  Specimens sent for flow cytometry, cytogenetics and additional studies.  Signed Scot Dock, NP

## 2019-08-07 NOTE — Patient Instructions (Signed)
Bone Marrow Aspiration and Bone Marrow Biopsy, Adult, Care After This sheet gives you information about how to care for yourself after your procedure. Your health care provider may also give you more specific instructions. If you have problems or questions, contact your health care provider. What can I expect after the procedure? After the procedure, it is common to have:  Mild pain and tenderness.  Swelling.  Bruising. Follow these instructions at home: Puncture site care   Follow instructions from your health care provider about how to take care of the puncture site. Make sure you: ? Wash your hands with soap and water before and after you change your bandage (dressing). If soap and water are not available, use hand sanitizer. ? Change your dressing as told by your health care provider.  Check your puncture site every day for signs of infection. Check for: ? More redness, swelling, or pain. ? Fluid or blood. ? Warmth. ? Pus or a bad smell. Activity  Return to your normal activities as told by your health care provider. Ask your health care provider what activities are safe for you.  Do not lift anything that is heavier than 10 lb (4.5 kg), or the limit that you are told, until your health care provider says that it is safe.  Do not drive for 24 hours if you were given a sedative during your procedure. General instructions   Take over-the-counter and prescription medicines only as told by your health care provider.  Do not take baths, swim, or use a hot tub until your health care provider approves. Ask your health care provider if you may take showers. You may only be allowed to take sponge baths.  If directed, put ice on the affected area. To do this: ? Put ice in a plastic bag. ? Place a towel between your skin and the bag. ? Leave the ice on for 20 minutes, 2-3 times a day.  Keep all follow-up visits as told by your health care provider. This is important. Contact a  health care provider if:  Your pain is not controlled with medicine.  You have a fever.  You have more redness, swelling, or pain around the puncture site.  You have fluid or blood coming from the puncture site.  Your puncture site feels warm to the touch.  You have pus or a bad smell coming from the puncture site. Summary  After the procedure, it is common to have mild pain, tenderness, swelling, and bruising.  Follow instructions from your health care provider about how to take care of the puncture site and what activities are safe for you.  Take over-the-counter and prescription medicines only as told by your health care provider.  Contact a health care provider if you have any signs of infection, such as fluid or blood coming from the puncture site. This information is not intended to replace advice given to you by your health care provider. Make sure you discuss any questions you have with your health care provider. Document Revised: 05/14/2018 Document Reviewed: 05/14/2018 Elsevier Patient Education  2020 Elsevier Inc.  

## 2019-08-07 NOTE — Progress Notes (Signed)
Bone marrow biopsy and aspiration completed by Wilber Bihari, NP. Procedure completed at 0835. Patient observed for 30 minutes and vitals retaken. Patient vitals remained stable and patient had no complaints. Dressing remained clean, dry, and intact. Patient educated on after-care for site.

## 2019-08-08 ENCOUNTER — Telehealth: Payer: Self-pay | Admitting: Oncology

## 2019-08-08 NOTE — Telephone Encounter (Signed)
No 7/29 los. No changes made to pt's schedule.  

## 2019-08-15 ENCOUNTER — Telehealth: Payer: Self-pay | Admitting: *Deleted

## 2019-08-15 NOTE — Telephone Encounter (Signed)
-----  Message from [name] IV, MD sent at 08/15/2019  9:34 AM EDT ----- Please call Mr. Yurkovich to let him know that his bone marrow biopsy looked great, there was no evidence of a blood cancer or bone marrow disorder. His finding is consistent with MGUS, an abnormal protein that needs routine surveillance.   Additionally we have just learned that he has been following with the cancer center in [facility] for this for 5 years. I would recommend he continue to follow with them. I spoke with his provider today who noted she would schedule to see him back in 3-4 months.   Please cancel his visits in Oct and Jan and make him aware that he only needs to follow with the cancer center in [place].   ----- Message ----- From: Interface, Lab [facility] Sent: 08/12/2019   1:51 PM EDT To: Orson Slick, MD

## 2019-08-15 NOTE — Telephone Encounter (Signed)
Attempted call to patient. No answer but was able to leave vm message for pt to call back on 08/18/19 for his bone marrow biopsy results.

## 2019-08-19 ENCOUNTER — Encounter: Payer: Self-pay | Admitting: Gastroenterology

## 2019-08-19 ENCOUNTER — Other Ambulatory Visit: Payer: Self-pay

## 2019-08-19 ENCOUNTER — Ambulatory Visit (AMBULATORY_SURGERY_CENTER): Payer: Self-pay

## 2019-08-19 ENCOUNTER — Telehealth: Payer: Self-pay

## 2019-08-19 ENCOUNTER — Encounter (HOSPITAL_COMMUNITY): Payer: Self-pay | Admitting: Hematology and Oncology

## 2019-08-19 VITALS — Ht 74.0 in | Wt 326.2 lb

## 2019-08-19 DIAGNOSIS — Z86010 Personal history of colonic polyps: Secondary | ICD-10-CM

## 2019-08-19 DIAGNOSIS — Z1211 Encounter for screening for malignant neoplasm of colon: Secondary | ICD-10-CM

## 2019-08-19 LAB — SURGICAL PATHOLOGY

## 2019-08-19 MED ORDER — SUTAB 1479-225-188 MG PO TABS: 12.0000 | ORAL_TABLET | ORAL | 0 refills | Status: DC

## 2019-08-19 NOTE — Progress Notes (Signed)
No allergies to soy or egg Pt is not on blood thinners or diet pills Denies issues with sedation/intubation Denies atrial flutter/fib Denies constipation   Pt is aware of Covid safety and care partner requirements.      

## 2019-08-19 NOTE — Telephone Encounter (Signed)
Did speak with daughter gave her updated message concerning biopsy

## 2019-08-20 ENCOUNTER — Telehealth: Payer: Self-pay | Admitting: Gastroenterology

## 2019-08-20 NOTE — Telephone Encounter (Signed)
Pt states his pharmacy will not accept the coupon for his SUTAB, pt would like to know if there is something else that could be offered to him

## 2019-08-27 NOTE — Telephone Encounter (Signed)
Spoke with the pt's pharmacy, the coupon did work and the [place] was $40 and the patient did pick this up. Called pt, no answer.

## 2019-09-02 ENCOUNTER — Other Ambulatory Visit: Payer: Self-pay

## 2019-09-02 ENCOUNTER — Ambulatory Visit (AMBULATORY_SURGERY_CENTER): Payer: PPO | Admitting: Gastroenterology

## 2019-09-02 ENCOUNTER — Encounter: Payer: Self-pay | Admitting: Gastroenterology

## 2019-09-02 VITALS — BP 148/98 | HR 71 | Temp 97.2°F | Resp 20 | Ht 74.0 in | Wt 326.2 lb

## 2019-09-02 DIAGNOSIS — Z1211 Encounter for screening for malignant neoplasm of colon: Secondary | ICD-10-CM | POA: Diagnosis not present

## 2019-09-02 DIAGNOSIS — Z800 Family history of malignant neoplasm of digestive organs: Secondary | ICD-10-CM

## 2019-09-02 DIAGNOSIS — D122 Benign neoplasm of ascending colon: Secondary | ICD-10-CM | POA: Diagnosis not present

## 2019-09-02 MED ORDER — SODIUM CHLORIDE 0.9 % IV SOLN: 500.0000 mL | Freq: Once | INTRAVENOUS | Status: DC

## 2019-09-02 MED ORDER — HYDROCORTISONE (PERIANAL) 2.5 % EX CREA: 1.0000 "application " | TOPICAL_CREAM | Freq: Two times a day (BID) | CUTANEOUS | 0 refills | Status: AC

## 2019-09-02 NOTE — Progress Notes (Signed)
pt tolerated well. VSS. awake and to recovery. Report given to RN.  

## 2019-09-02 NOTE — Progress Notes (Signed)
Pt's states no medical or surgical changes since previsit or office visit.  CW-VS   BP taken while standing d/t his difficulty getting up and down.  199/132  Reck.  169/114 Reck 158/104  Santiago Glad the CRNA notified

## 2019-09-02 NOTE — Op Note (Signed)
[facility] Patient Name: Anthony Le Procedure Date: 09/02/2019 8:43 AM MRN: [id number] Endoscopist: Jackquline Denmark , MD Age: 64 Referring MD:  Date of Birth: [birth year] Gender: Male Account #: [id number] Procedure:                Colonoscopy Indications:              Screening in patient at increased risk: Colorectal                            cancer in father before age 81 Medicines:                Monitored Anesthesia Care Procedure:                Pre-Anesthesia Assessment:                           - Prior to the procedure, a History and Physical                            was performed, and patient medications and                            allergies were reviewed. The patient's tolerance of                            previous anesthesia was also reviewed. The risks                            and benefits of the procedure and the sedation                            options and risks were discussed with the patient.                            All questions were answered, and informed consent                            was obtained. Prior Anticoagulants: The patient has                            taken no previous anticoagulant or antiplatelet                            agents. ASA Grade Assessment: III - A patient with                            severe systemic disease. After reviewing the risks                            and benefits, the patient was deemed in                            satisfactory condition to undergo the procedure.  After obtaining informed consent, the colonoscope                            was passed under direct vision. Throughout the                            procedure, the patient's blood pressure, pulse, and                            oxygen saturations were monitored continuously. The                            Colonoscope was introduced through the anus and                            advanced to the 2 cm into the  ileum. The                            colonoscopy was performed without difficulty. The                            patient tolerated the procedure well. The quality                            of the bowel preparation was good. The terminal                            ileum, ileocecal valve, appendiceal orifice, and                            rectum were photographed. Scope In: 8:53:19 AM Scope Out: 9:04:03 AM Scope Withdrawal Time: 0 hours 8 minutes 8 seconds  Total Procedure Duration: 0 hours 10 minutes 44 seconds  Findings:                 A 4 mm polyp was found in the proximal ascending                            colon. The polyp was sessile. The polyp was removed                            with a cold biopsy forceps. Resection and retrieval                            were complete.                           Multiple medium-mouthed diverticula were found in                            the sigmoid colon and few in ascending colon.                           Non-bleeding internal hemorrhoids were found during  retroflexion and during perianal exam. The                            hemorrhoids were moderate and Grade IV (internal                            hemorrhoids that prolapse and cannot be reduced                            manually).                           The terminal ileum appeared normal.                           The exam was otherwise without abnormality on                            direct and retroflexion views. Complications:            No immediate complications. Estimated Blood Loss:     Estimated blood loss: none. Impression:               - One 4 mm polyp in the proximal ascending colon,                            removed with a cold biopsy forceps. Resected and                            retrieved.                           - Moderate predominantly sigmoid diverticulosis                           - Non-bleeding internal hemorrhoids.                            - Otherwise normal colonoscopy to TI. Recommendation:           - Patient has a contact number available for                            emergencies. The signs and symptoms of potential                            delayed complications were discussed with the                            patient. Return to normal activities tomorrow.                            Written discharge instructions were provided to the                            patient.                           -  High fiber diet.                           - Continue present medications.                           - Await pathology results.                           - Repeat colonoscopy likely in 5 years for                            surveillance based on pathology results.                           - HC 2.5% cream 1 twice daily after bowel movements                            x 10 days, then as needed. If he has any anorectal                            problems, I would have low threshold in referring                            him for surgical opinion for possible                            EUA/hemorrhoidectomy.                           - Return to GI office PRN.                           - The findings and recommendations were discussed                            with the patient's Sister Benjamine Mola.Jackquline Denmark, MD 09/02/2019 9:10:39 AM This report has been signed electronically.

## 2019-09-02 NOTE — Progress Notes (Signed)
Called to room to assist during endoscopic procedure.  Patient ID and intended procedure confirmed with present staff. Received instructions for my participation in the procedure from the performing physician.  

## 2019-09-02 NOTE — Patient Instructions (Signed)
Information on polyps,diverticulosis, & hemorrhoids given to you today  Await pathology results on polyp removed   Hemorrhoid cream medication sent to your pharmacy- [facility]  Follow high fiber diet - handout given to you today   YOU HAD AN ENDOSCOPIC PROCEDURE TODAY AT [facility]:   Refer to the procedure report that was given to you for any specific questions about what was found during the examination.  If the procedure report does not answer your questions, please call your gastroenterologist to clarify.  If you requested that your care partner not be given the details of your procedure findings, then the procedure report has been included in a sealed envelope for you to review at your convenience later.  YOU SHOULD EXPECT: Some feelings of bloating in the abdomen. Passage of more gas than usual.  Walking can help get rid of the air that was put into your GI tract during the procedure and reduce the bloating. If you had a lower endoscopy (such as a colonoscopy or flexible sigmoidoscopy) you may notice spotting of blood in your stool or on the toilet paper. If you underwent a bowel prep for your procedure, you may not have a normal bowel movement for a few days.  Please Note:  You might notice some irritation and congestion in your nose or some drainage.  This is from the oxygen used during your procedure.  There is no need for concern and it should clear up in a day or so.  SYMPTOMS TO REPORT IMMEDIATELY:   Following lower endoscopy (colonoscopy or flexible sigmoidoscopy):  Excessive amounts of blood in the stool  Significant tenderness or worsening of abdominal pains  Swelling of the abdomen that is new, acute  Fever of 100F or higher    For urgent or emergent issues, a gastroenterologist can be reached at any hour by calling [phone]. Do not use MyChart messaging for urgent concerns.    DIET:  We do recommend a small meal at first, but then you may  proceed to your regular diet.  Drink plenty of fluids but you should avoid alcoholic beverages for 24 hours.  ACTIVITY:  You should plan to take it easy for the rest of today and you should NOT DRIVE or use heavy machinery until tomorrow (because of the sedation medicines used during the test).    FOLLOW UP: Our staff will call the number listed on your records 48-72 hours following your procedure to check on you and address any questions or concerns that you may have regarding the information given to you following your procedure. If we do not reach you, we will leave a message.  We will attempt to reach you two times.  During this call, we will ask if you have developed any symptoms of COVID 19. If you develop any symptoms (ie: fever, flu-like symptoms, shortness of breath, cough etc.) before then, please call [phone].  If you test positive for Covid 19 in the 2 weeks post procedure, please call and report this information to [place].    If any biopsies were taken you will be contacted by phone or by letter within the next 1-3 weeks.  Please call us at [phone] if you have not heard about the biopsies in 3 weeks.    SIGNATURES/CONFIDENTIALITY: You and/or your care partner have signed paperwork which will be entered into your electronic medical record.  These signatures attest to the fact that that the information above on your After Visit  Summary has been reviewed and is understood.  Full responsibility of the confidentiality of this discharge information lies with you and/or your care-partner. 

## 2019-09-04 ENCOUNTER — Telehealth: Payer: Self-pay

## 2019-09-04 NOTE — Telephone Encounter (Signed)
  Follow up Call-  Call back number 09/02/2019  Post procedure Call Back phone  # [phone]  Permission to leave phone message Yes  Some recent data might be hidden     Patient questions:  Do you have a fever, pain , or abdominal swelling? No. Pain Score  0 *  Have you tolerated food without any problems? Yes.    Have you been able to return to your normal activities? Yes.    Do you have any questions about your discharge instructions: Diet   No. Medications  No. Follow up visit  No.  Do you have questions or concerns about your Care? No.  Actions: * If pain score is 4 or above: No action needed, pain <4.  1. Have you developed a fever since your procedure? No   2.   Have you had an respiratory symptoms (SOB or cough) since your procedure? No   3.   Have you tested positive for COVID 19 since your procedure no   4.   Have you had any family members/close contacts diagnosed with the COVID 19 since your procedure?  No    If yes to any of these questions please route to Joylene John, RN and Joella Prince, RN

## 2019-09-07 ENCOUNTER — Encounter: Payer: Self-pay | Admitting: Gastroenterology

## 2019-10-26 ENCOUNTER — Other Ambulatory Visit: Payer: Self-pay | Admitting: Hematology and Oncology

## 2019-10-26 DIAGNOSIS — D472 Monoclonal gammopathy: Secondary | ICD-10-CM

## 2019-10-26 NOTE — Progress Notes (Signed)
Return to local [facility]. No f/u with [place] is required.

## 2019-10-27 ENCOUNTER — Inpatient Hospital Stay: Payer: PPO

## 2019-10-27 ENCOUNTER — Inpatient Hospital Stay: Payer: PPO | Admitting: Hematology and Oncology

## 2019-10-27 ENCOUNTER — Telehealth: Payer: Self-pay | Admitting: *Deleted

## 2019-10-27 DIAGNOSIS — D472 Monoclonal gammopathy: Secondary | ICD-10-CM

## 2019-10-27 NOTE — Telephone Encounter (Signed)
Received vm message from Amy @ [facility]. She is calling to say that this patient is now being followed by Dr. Hinton Rao and for [place] to cancel any appts pt has here. TCT Amy and left her a voice mail stating that Mr. Anthony Le appts have been cancelled.

## 2019-11-12 DIAGNOSIS — Z23 Encounter for immunization: Secondary | ICD-10-CM | POA: Diagnosis not present

## 2019-11-12 DIAGNOSIS — N049 Nephrotic syndrome with unspecified morphologic changes: Secondary | ICD-10-CM | POA: Diagnosis not present

## 2019-11-12 DIAGNOSIS — M1A9XX0 Chronic gout, unspecified, without tophus (tophi): Secondary | ICD-10-CM | POA: Diagnosis not present

## 2019-11-12 DIAGNOSIS — E785 Hyperlipidemia, unspecified: Secondary | ICD-10-CM | POA: Diagnosis not present

## 2019-11-12 DIAGNOSIS — I10 Essential (primary) hypertension: Secondary | ICD-10-CM | POA: Diagnosis not present

## 2019-11-12 DIAGNOSIS — N183 Chronic kidney disease, stage 3 unspecified: Secondary | ICD-10-CM | POA: Diagnosis not present

## 2019-11-12 DIAGNOSIS — E1142 Type 2 diabetes mellitus with diabetic polyneuropathy: Secondary | ICD-10-CM | POA: Diagnosis not present

## 2019-11-17 ENCOUNTER — Telehealth: Payer: Self-pay | Admitting: Oncology

## 2019-11-17 NOTE — Telephone Encounter (Signed)
Scheduled appts per 11/7 staff msg. Left voicemail with appt dates and times.

## 2019-11-24 ENCOUNTER — Other Ambulatory Visit: Payer: Self-pay | Admitting: Hematology and Oncology

## 2019-11-24 ENCOUNTER — Inpatient Hospital Stay: Payer: PPO | Attending: Oncology

## 2019-11-24 ENCOUNTER — Telehealth: Payer: Self-pay

## 2019-11-24 ENCOUNTER — Other Ambulatory Visit: Payer: Self-pay

## 2019-11-24 DIAGNOSIS — Z7901 Long term (current) use of anticoagulants: Secondary | ICD-10-CM | POA: Insufficient documentation

## 2019-11-24 DIAGNOSIS — Z79899 Other long term (current) drug therapy: Secondary | ICD-10-CM | POA: Insufficient documentation

## 2019-11-24 DIAGNOSIS — N189 Chronic kidney disease, unspecified: Secondary | ICD-10-CM | POA: Insufficient documentation

## 2019-11-24 DIAGNOSIS — D472 Monoclonal gammopathy: Secondary | ICD-10-CM

## 2019-11-24 DIAGNOSIS — Z86711 Personal history of pulmonary embolism: Secondary | ICD-10-CM | POA: Insufficient documentation

## 2019-11-24 DIAGNOSIS — Z85068 Personal history of other malignant neoplasm of small intestine: Secondary | ICD-10-CM | POA: Diagnosis not present

## 2019-11-24 LAB — CMP (CANCER CENTER ONLY)
ALT: 20 U/L (ref 0–44)
AST: 23 U/L (ref 15–41)
Albumin: 3.4 g/dL — ABNORMAL LOW (ref 3.5–5.0)
Alkaline Phosphatase: 63 U/L (ref 38–126)
Anion gap: 9 (ref 5–15)
BUN: 21 mg/dL (ref 8–23)
CO2: 24 mmol/L (ref 22–32)
Calcium: 8.9 mg/dL (ref 8.9–10.3)
Chloride: 108 mmol/L (ref 98–111)
Creatinine: 1.65 mg/dL — ABNORMAL HIGH (ref 0.61–1.24)
GFR, Estimated: 46 mL/min — ABNORMAL LOW (ref >60)
Glucose, Bld: 101 mg/dL — ABNORMAL HIGH (ref 70–99)
Potassium: 4.1 mmol/L (ref 3.5–5.1)
Sodium: 141 mmol/L (ref 135–145)
Total Bilirubin: 0.3 mg/dL (ref 0.3–1.2)
Total Protein: 6.8 g/dL (ref 6.5–8.1)

## 2019-11-24 LAB — CBC WITH DIFFERENTIAL (CANCER CENTER ONLY)
Abs Immature Granulocytes: 0.02 10*3/uL (ref 0.00–0.07)
Basophils Absolute: 0 10*3/uL (ref 0.0–0.1)
Basophils Relative: 1 %
Eosinophils Absolute: 0.2 10*3/uL (ref 0.0–0.5)
Eosinophils Relative: 3 %
HCT: 41.2 % (ref 39.0–52.0)
Hemoglobin: 14 g/dL (ref 13.0–17.0)
Immature Granulocytes: 0 %
Lymphocytes Relative: 27 %
Lymphs Abs: 2 10*3/uL (ref 0.7–4.0)
MCH: 26.3 pg (ref 26.0–34.0)
MCHC: 34 g/dL (ref 30.0–36.0)
MCV: 77.3 fL — ABNORMAL LOW (ref 80.0–100.0)
Monocytes Absolute: 0.8 10*3/uL (ref 0.1–1.0)
Monocytes Relative: 11 %
Neutro Abs: 4.2 10*3/uL (ref 1.7–7.7)
Neutrophils Relative %: 58 %
Platelet Count: 233 10*3/uL (ref 150–400)
RBC: 5.33 MIL/uL (ref 4.22–5.81)
RDW: 17.1 % — ABNORMAL HIGH (ref 11.5–15.5)
WBC Count: 7.3 10*3/uL (ref 4.0–10.5)
nRBC: 0 % (ref 0.0–0.2)

## 2019-11-24 NOTE — Telephone Encounter (Signed)
error 

## 2019-11-25 LAB — KAPPA/LAMBDA LIGHT CHAINS
Kappa free light chain: 128.6 mg/L — ABNORMAL HIGH (ref 3.3–19.4)
Kappa, lambda light chain ratio: 3.49 — ABNORMAL HIGH (ref 0.26–1.65)
Lambda free light chains: 36.8 mg/L — ABNORMAL HIGH (ref 5.7–26.3)

## 2019-11-25 LAB — MULTIPLE MYELOMA PANEL, SERUM
Albumin SerPl Elph-Mcnc: 3.1 g/dL (ref 2.9–4.4)
Albumin/Glob SerPl: 1.1 (ref 0.7–1.7)
Alpha 1: 0.3 g/dL (ref 0.0–0.4)
Alpha2 Glob SerPl Elph-Mcnc: 0.8 g/dL (ref 0.4–1.0)
B-Globulin SerPl Elph-Mcnc: 1 g/dL (ref 0.7–1.3)
Gamma Glob SerPl Elph-Mcnc: 0.9 g/dL (ref 0.4–1.8)
Globulin, Total: 3 g/dL (ref 2.2–3.9)
IgA: 533 mg/dL — ABNORMAL HIGH (ref 61–437)
IgG (Immunoglobin G), Serum: 807 mg/dL (ref 603–1613)
IgM (Immunoglobulin M), Srm: 38 mg/dL (ref 20–172)
M Protein SerPl Elph-Mcnc: 0.2 g/dL — ABNORMAL HIGH
Total Protein ELP: 6.1 g/dL (ref 6.0–8.5)

## 2019-12-01 NOTE — Progress Notes (Signed)
[facility]  [address] [place],  [place]  [address] [phone]  Clinic Day:  12/03/2019  Referring physician: Nicoletta Dress, MD   This document serves as a record of services personally performed by Hosie Poisson, MD. It was created on their behalf by [facility], a trained medical scribe. The creation of this record is based on the scribe's personal observations and the provider's statements to them.   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  This was treated with surgery and adjuvant chemotherapy.  He has never had evidence of recurrence.  We had been following him yearly. He was referred in June 2016 because of a small monoclonal spike on serum protein electrophoresis, felt to represent monoclonal gammopathy of uncertain significance, IgA kappa.  This was 0.2 at that time, and has ranged from 0.2 to 0.4 for over 5 years.  The urine protein electrophoresis was negative for a monoclonal spike even though he had 3+ proteinuria.  He also has had and increase in the free kappa light chains.  He has had chronic microcytosis, without anemia.  Previous iron studies were normal.  He also has a history of pulmonary embolism, treated with Coumadin anticoagulation.  This is monitored by  Dr. Delena Bali.  We have been seeing him every 6 months to follow the MGUS.     INTERVAL HISTORY:  Anthony Le is here for routine follow up and states that he underwent a bone marrow aspirate and biopsy back in July which was negative for multiple myeloma.  He had been referred by Dr. Justin Mend to Dr. Demetria Pore, since neither of them realized that we have been following this for over 5 and 1/2 years.  He underwent colonoscopy with Dr. Lyndel Safe on  August 24th and repeat in 5 years was recommended.  Otherwise, he has been well and denies complaints.  His  appetite is good, and he has gained 5 pounds since his last visit.  He denies fever, chills or other signs of infection.  He denies nausea, vomiting, bowel issues, or abdominal pain.  He denies sore throat, cough, dyspnea, or chest pain.   REVIEW OF SYSTEMS:  Review of Systems  Constitutional: Negative.   HENT:  Negative.   Eyes: Negative.   Respiratory: Negative.   Cardiovascular: Negative.   Gastrointestinal: Negative.   Endocrine: Negative.   Genitourinary: Negative.    Musculoskeletal: Negative.   Skin: Negative.   Neurological: Negative.   Hematological: Negative.   Psychiatric/Behavioral: Negative.   All other systems reviewed and are negative.    VITALS:  Blood pressure (!) 183/100, pulse (!) 101, temperature 97.6 F (36.4 C), resp. rate 18, height _0  (1.803 m), weight (!) 339 lb 8 oz (154 kg), SpO2 96 %.  Wt Readings from Last 3 Encounters:  12/03/19 (!) 339 lb 8 oz (154 kg)  09/02/19 (!) 326 lb 3.2 oz (148 kg)  08/19/19 (!) 326 lb 3.2 oz (148 kg)    Body mass index is 47.35 kg/m.  Performance status (ECOG): 0 - Asymptomatic  PHYSICAL EXAM:  Physical Exam Constitutional:      General: He is not in acute distress.    Appearance: Normal appearance. He is normal weight.  HENT:  Head: Normocephalic and atraumatic.  Eyes:     General: No scleral icterus.    Extraocular Movements: Extraocular movements intact.     Conjunctiva/sclera: Conjunctivae normal.     Pupils: Pupils are equal, round, and reactive to light.  Cardiovascular:     Rate and Rhythm: Regular rhythm. Tachycardia present.     Pulses: Normal pulses.     Heart sounds: Normal heart sounds. No murmur heard.  No friction rub. No gallop.   Pulmonary:     Effort: Pulmonary effort is normal. No respiratory distress.     Breath sounds: Normal breath sounds.  Abdominal:     General: Bowel  sounds are normal. There is no distension.     Palpations: Abdomen is soft. There is no mass.     Tenderness: There is no abdominal tenderness.  Musculoskeletal:        General: Normal range of motion.     Cervical back: Normal range of motion and neck supple.     Right lower leg: No edema.     Left lower leg: No edema.  Lymphadenopathy:     Cervical: No cervical adenopathy.  Skin:    General: Skin is warm and dry.  Neurological:     General: No focal deficit present.     Mental Status: He is alert and oriented to person, place, and time. Mental status is at baseline.  Psychiatric:        Mood and Affect: Mood normal.        Behavior: Behavior normal.        Thought Content: Thought content normal.        Judgment: Judgment normal.     LABS:   CBC Latest Ref Rng & Units 11/24/2019 08/07/2019 07/24/2019  WBC 4.0 - 10.5 K/uL 7.3 8.3 7.8  Hemoglobin 13.0 - 17.0 g/dL 14.0 13.7 15.0  Hematocrit 39 - 52 % 41.2 40.3 43.2  Platelets 150 - 400 K/uL 233 185 213   CMP Latest Ref Rng & Units 11/24/2019 07/24/2019  Glucose 70 - 99 mg/dL 101(H) 103(H)  BUN 8 - 23 mg/dL 21 28(H)  Creatinine 0.61 - 1.24 mg/dL 1.65(H) 1.82(H)  Sodium 135 - 145 mmol/L 141 140  Potassium 3.5 - 5.1 mmol/L 4.1 4.6  Chloride 98 - 111 mmol/L 108 107  CO2 22 - 32 mmol/L 24 23  Calcium 8.9 - 10.3 mg/dL 8.9 9.7  Total Protein 6.5 - 8.1 g/dL 6.8 8.1  Total Bilirubin 0.3 - 1.2 mg/dL 0.3 0.7  Alkaline Phos 38 - 126 U/L 63 74  AST 15 - 41 U/L 23 29  ALT 0 - 44 U/L 20 26     Lab Results  Component Value Date   TOTALPROTELP 6.1 11/24/2019   No results found for: TIBC, FERRITIN, IRONPCTSAT Lab Results  Component Value Date   LDH 183 07/24/2019   M-spike from 11/15 was stable at 0.2 with an IgA of 533, improved from 586.    STUDIES:  No results found.   Allergies: No Known Allergies  Current Medications: Current Outpatient Medications  Medication Sig Dispense Refill  . allopurinol (ZYLOPRIM) 300 MG  tablet Take 300 mg by mouth daily.    Marland Kitchen DIOVAN 320 MG tablet     . Febuxostat (ULORIC) 80 MG TABS Take 80 mg by mouth.    . furosemide (LASIX) 40 MG tablet Take by mouth.    . hydrocortisone (ANUSOL-HC) 2.5 % rectal cream Place 1 application rectally 2 (two) times daily. Hydrocortisone  2.5% cream rectal area twice daily after bowel movements for 10 days then as needed 30 g 0  . lisinopril (PRINIVIL,ZESTRIL) 40 MG tablet Take 40 mg by mouth.    . metoprolol succinate (TOPROL-XL) 50 MG 24 hr tablet Take 50 mg by mouth daily. (Patient not taking: Reported on 08/19/2019)    . Omega-3 1000 MG CAPS Take 1 g by mouth.    . rosuvastatin (CRESTOR) 40 MG tablet Take 40 mg by mouth daily.    . silver sulfADIAZINE (SILVADENE) 1 % cream Apply 1 application topically daily. (Patient not taking: Reported on 08/19/2019) 50 g 2   No current facility-administered medications for this visit.     ASSESSMENT & PLAN:   Assessment:   1. Remote history of small bowel adenocarcinoma.  He remains without symptoms of recurrent disease.  He had his colonoscopy in August 2021.  2. Monoclonal gammopathy of uncertain significance, IgA kappa.  This remains stable at 0.2 g/dL    3. Stable chronic kidney disease with latest creatinine down to 1.65.  4. Morbid obesity.  I think this is contributing to his dyspnea.   Plan: As he is doing well and remains stable, I will see him back in 6 months with CBC, comprehensive metabolic profile, quantitative immunoglobulins, serum protein electrophoresis, and serum light chains.  He understands and agrees with this plan of care.   I provided 15 minutes of face-to-face time during this this encounter and > 50% was spent counseling as documented under my assessment and plan.    Derwood Kaplan, MD [facility] AT [facility] [address] [place] [address] Dept: [phone] Dept Fax: [phone]   I, Rita Ohara, am  acting as scribe for Derwood Kaplan, MD  I have reviewed this report as typed by the medical scribe, and it is complete and accurate.

## 2019-12-03 ENCOUNTER — Inpatient Hospital Stay (INDEPENDENT_AMBULATORY_CARE_PROVIDER_SITE_OTHER): Payer: PPO | Admitting: Oncology

## 2019-12-03 ENCOUNTER — Other Ambulatory Visit: Payer: Self-pay

## 2019-12-03 DIAGNOSIS — D472 Monoclonal gammopathy: Secondary | ICD-10-CM

## 2020-01-17 DIAGNOSIS — R601 Generalized edema: Secondary | ICD-10-CM | POA: Diagnosis not present

## 2020-01-17 DIAGNOSIS — R051 Acute cough: Secondary | ICD-10-CM | POA: Diagnosis not present

## 2020-01-17 DIAGNOSIS — I10 Essential (primary) hypertension: Secondary | ICD-10-CM | POA: Diagnosis not present

## 2020-01-17 DIAGNOSIS — Z20828 Contact with and (suspected) exposure to other viral communicable diseases: Secondary | ICD-10-CM | POA: Diagnosis not present

## 2020-01-17 DIAGNOSIS — R0600 Dyspnea, unspecified: Secondary | ICD-10-CM | POA: Diagnosis not present

## 2020-01-26 ENCOUNTER — Ambulatory Visit: Payer: PPO | Admitting: Hematology and Oncology

## 2020-01-26 ENCOUNTER — Other Ambulatory Visit: Payer: PPO

## 2020-05-11 DIAGNOSIS — E785 Hyperlipidemia, unspecified: Secondary | ICD-10-CM | POA: Diagnosis not present

## 2020-05-11 DIAGNOSIS — M1A9XX0 Chronic gout, unspecified, without tophus (tophi): Secondary | ICD-10-CM | POA: Diagnosis not present

## 2020-05-11 DIAGNOSIS — N183 Chronic kidney disease, stage 3 unspecified: Secondary | ICD-10-CM | POA: Diagnosis not present

## 2020-05-11 DIAGNOSIS — Z125 Encounter for screening for malignant neoplasm of prostate: Secondary | ICD-10-CM | POA: Diagnosis not present

## 2020-05-11 DIAGNOSIS — N481 Balanitis: Secondary | ICD-10-CM | POA: Diagnosis not present

## 2020-05-11 DIAGNOSIS — I10 Essential (primary) hypertension: Secondary | ICD-10-CM | POA: Diagnosis not present

## 2020-05-11 DIAGNOSIS — E1142 Type 2 diabetes mellitus with diabetic polyneuropathy: Secondary | ICD-10-CM | POA: Diagnosis not present

## 2020-05-25 ENCOUNTER — Inpatient Hospital Stay: Payer: PPO | Attending: Internal Medicine

## 2020-05-25 ENCOUNTER — Other Ambulatory Visit: Payer: Self-pay | Admitting: Hematology and Oncology

## 2020-05-25 DIAGNOSIS — D472 Monoclonal gammopathy: Secondary | ICD-10-CM

## 2020-06-01 ENCOUNTER — Ambulatory Visit: Payer: PPO | Admitting: Oncology

## 2020-06-02 ENCOUNTER — Inpatient Hospital Stay: Payer: PPO | Admitting: Oncology

## 2020-06-14 DIAGNOSIS — Z9181 History of falling: Secondary | ICD-10-CM | POA: Diagnosis not present

## 2020-06-14 DIAGNOSIS — E785 Hyperlipidemia, unspecified: Secondary | ICD-10-CM | POA: Diagnosis not present

## 2020-06-14 DIAGNOSIS — E669 Obesity, unspecified: Secondary | ICD-10-CM | POA: Diagnosis not present

## 2020-06-14 DIAGNOSIS — Z0000 Encounter for general adult medical examination without abnormal findings: Secondary | ICD-10-CM | POA: Diagnosis not present

## 2020-06-14 DIAGNOSIS — Z1331 Encounter for screening for depression: Secondary | ICD-10-CM | POA: Diagnosis not present

## 2020-07-08 DIAGNOSIS — R7303 Prediabetes: Secondary | ICD-10-CM | POA: Diagnosis not present

## 2020-08-26 NOTE — Progress Notes (Signed)
[place]  [address] [place],  [place]  [address] [phone]  Clinic Day:  09/02/2020  Referring physician: Nicoletta Dress, MD   This document serves as a record of services personally performed by Hosie Poisson, MD. It was created on their behalf by [facility], a trained medical scribe. The creation of this record is based on the scribe's personal observations and the provider's statements to them.  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  This was treated with surgery and adjuvant chemotherapy.  He has never had evidence of recurrence.  We had been following him yearly. He was referred in June 2016 because of a small monoclonal spike on serum protein electrophoresis, felt to represent monoclonal gammopathy of uncertain significance, IgA kappa.  This was 0.2 at that time, and has ranged from 0.2 to 0.4 for over 5 years.  The urine protein electrophoresis was negative for a monoclonal spike even though he had 3+ proteinuria.  He also has had and increase in the free kappa light chains.  He has had chronic microcytosis, without anemia.  Previous iron studies were normal.  He also has a history of pulmonary embolism, treated with Coumadin anticoagulation.  This is monitored by  Dr. Delena Bali.  We have been seeing him every 6 months to follow the MGUS.  He underwent a bone marrow aspirate and biopsy back in July 2021 which was negative for multiple myeloma.  He had been referred by Dr. Justin Mend to Dr. Demetria Pore, since neither of them realized that we have been following this for over 5 and 1/2 years.  He underwent colonoscopy in August 2021 with Dr. Lyndel Safe which was normal except for a 4 mm polyp of the ascending colon.   Surgical pathology confirmed a tubular adenoma, and repeat in 5 years was recommended.  INTERVAL HISTORY:  Anthony Le is here for routine follow up and states that he has been well.  He denies complaints today.  His  appetite is good, and he is eating well.  He denies fever, chills or other signs of infection.  He denies nausea, vomiting, bowel issues, or abdominal pain.  He denies sore throat, cough, dyspnea, or chest pain.  REVIEW OF SYSTEMS:  Review of Systems  Constitutional: Negative.  Negative for appetite change, chills, fatigue, fever and unexpected weight change.  HENT:  Negative.    Eyes: Negative.   Respiratory: Negative.  Negative for chest tightness, cough, hemoptysis, shortness of breath and wheezing.   Cardiovascular: Negative.  Negative for chest pain, leg swelling and palpitations.  Gastrointestinal: Negative.  Negative for abdominal distention, abdominal pain, blood in stool, constipation, diarrhea, nausea and vomiting.  Endocrine: Negative.   Genitourinary: Negative.  Negative for difficulty urinating, dysuria, frequency and hematuria.   Musculoskeletal: Negative.  Negative for arthralgias, back pain, flank pain, gait problem and myalgias.  Skin: Negative.   Neurological: Negative.  Negative for dizziness, extremity weakness, gait problem, headaches, light-headedness, numbness, seizures and speech difficulty.  Hematological: Negative.   Psychiatric/Behavioral: Negative.  Negative for depression and sleep disturbance. The patient is not nervous/anxious.   All other systems reviewed and are negative.   VITALS:  Blood pressure (!) 145/95, pulse 86, temperature 98.4 F (36.9 C), temperature source  Oral, resp. rate 20, height 6' 2"  (1.88 m), SpO2 95 %.  Wt Readings from Last 3 Encounters:  12/03/19 (!) 339 lb 8 oz (154 kg)  09/02/19 (!) 326 lb 3.2 oz (148 kg)  08/19/19 (!) 326 lb 3.2 oz (148 kg)    Body mass index is 43.59 kg/m.  Performance status (ECOG): 0 -  Asymptomatic  PHYSICAL EXAM:  Physical Exam Constitutional:      General: He is not in acute distress.    Appearance: Normal appearance. He is normal weight.  HENT:     Head: Normocephalic and atraumatic.  Eyes:     General: No scleral icterus.    Extraocular Movements: Extraocular movements intact.     Conjunctiva/sclera: Conjunctivae normal.     Pupils: Pupils are equal, round, and reactive to light.  Cardiovascular:     Rate and Rhythm: Normal rate and regular rhythm.     Pulses: Normal pulses.     Heart sounds: Normal heart sounds. No murmur heard.   No friction rub. No gallop.  Pulmonary:     Effort: Pulmonary effort is normal. No respiratory distress.     Breath sounds: Normal breath sounds.  Abdominal:     General: Bowel sounds are normal. There is no distension.     Palpations: Abdomen is soft. There is no hepatomegaly, splenomegaly or mass.     Tenderness: There is no abdominal tenderness.  Musculoskeletal:        General: Normal range of motion.     Cervical back: Normal range of motion and neck supple.     Right lower leg: Edema (trace) present.     Left lower leg: Edema (trace) present.  Lymphadenopathy:     Cervical: No cervical adenopathy.  Skin:    General: Skin is warm and dry.  Neurological:     General: No focal deficit present.     Mental Status: He is alert and oriented to person, place, and time. Mental status is at baseline.  Psychiatric:        Mood and Affect: Mood normal.        Behavior: Behavior normal.        Thought Content: Thought content normal.        Judgment: Judgment normal.    LABS:   CBC Latest Ref Rng & Units 11/24/2019 08/07/2019 07/24/2019  WBC 4.0 - 10.5 K/uL 7.3 8.3 7.8  Hemoglobin 13.0 - 17.0 g/dL 14.0 13.7 15.0  Hematocrit 39.0 - 52.0 % 41.2 40.3 43.2  Platelets 150 - 400 K/uL 233 185 213   CMP Latest Ref Rng & Units 11/24/2019 07/24/2019  Glucose 70 - 99 mg/dL 101(H) 103(H)  BUN 8 - 23 mg/dL 21 28(H)  Creatinine 0.61 -  1.24 mg/dL 1.65(H) 1.82(H)  Sodium 135 - 145 mmol/L 141 140  Potassium 3.5 - 5.1 mmol/L 4.1 4.6  Chloride 98 - 111 mmol/L 108 107  CO2 22 - 32 mmol/L 24 23  Calcium 8.9 - 10.3 mg/dL 8.9 9.7  Total Protein 6.5 - 8.1 g/dL 6.8 8.1  Total Bilirubin 0.3 - 1.2 mg/dL 0.3 0.7  Alkaline Phos 38 - 126 U/L 63 74  AST 15 - 41 U/L 23 29  ALT 0 - 44 U/L 20 26     Lab Results  Component Value Date   TOTALPROTELP 6.1 11/24/2019   No results found for: TIBC, FERRITIN, IRONPCTSAT Lab Results  Component Value Date   LDH 183 07/24/2019    STUDIES:  No results  found.   Allergies: No Known Allergies  Current Medications: Current Outpatient Medications  Medication Sig Dispense Refill   allopurinol (ZYLOPRIM) 300 MG tablet Take 300 mg by mouth daily.     DIOVAN 320 MG tablet      Febuxostat (ULORIC) 80 MG TABS Take 80 mg by mouth.     furosemide (LASIX) 40 MG tablet Take by mouth.     hydrocortisone (ANUSOL-HC) 2.5 % rectal cream Place 1 application rectally 2 (two) times daily. Hydrocortisone 2.5% cream rectal area twice daily after bowel movements for 10 days then as needed 30 g 0   lisinopril (PRINIVIL,ZESTRIL) 40 MG tablet Take 40 mg by mouth.     metoprolol succinate (TOPROL-XL) 50 MG 24 hr tablet Take 50 mg by mouth daily. (Patient not taking: Reported on 08/19/2019)     Omega-3 1000 MG CAPS Take 1 g by mouth.     rosuvastatin (CRESTOR) 40 MG tablet Take 40 mg by mouth daily.     silver sulfADIAZINE (SILVADENE) 1 % cream Apply 1 application topically daily. (Patient not taking: Reported on 08/19/2019) 50 g 2   No current facility-administered medications for this visit.    ASSESSMENT & PLAN:   Assessment:   1. Remote history of small bowel adenocarcinoma.  He remains without symptoms of recurrent disease, diagnosed in September 2004.  He had his colonoscopy in August 2021 and repeat in 5 years was recommended.  2. Monoclonal gammopathy of uncertain significance, IgA kappa.  This  remains stable at 0.2 g/dL.    3. Stable chronic kidney disease.  4. Morbid obesity.   Plan: I will call him with the results of his blood work.  As he is doing well and remains stable, I will see him back in 6 months with CBC, comprehensive metabolic profile, quantitative immunoglobulins, serum protein electrophoresis.  We will continue to draw labs 2 weeks prior to his appointments.  He understands and agrees with this plan of care.   I provided 15 minutes of face-to-face time during this this encounter and > 50% was spent counseling as documented under my assessment and plan.    Derwood Kaplan, MD [facility] AT [facility] [address] [place] [address] Dept: [phone] Dept Fax: [phone]   I, Rita Ohara, am acting as scribe for Derwood Kaplan, MD  I have reviewed this report as typed by the medical scribe, and it is complete and accurate.

## 2020-08-31 ENCOUNTER — Telehealth: Payer: Self-pay | Admitting: Oncology

## 2020-08-31 NOTE — Telephone Encounter (Signed)
Patient called to verify 8/25 Follow Up w/Dr Hinton Rao

## 2020-09-02 ENCOUNTER — Inpatient Hospital Stay: Payer: PPO | Attending: Oncology | Admitting: Oncology

## 2020-09-02 ENCOUNTER — Other Ambulatory Visit: Payer: Self-pay

## 2020-09-02 ENCOUNTER — Inpatient Hospital Stay: Payer: PPO

## 2020-09-02 VITALS — BP 145/95 | HR 86 | Temp 98.4°F | Resp 20 | Ht 74.0 in

## 2020-09-02 DIAGNOSIS — D472 Monoclonal gammopathy: Secondary | ICD-10-CM | POA: Diagnosis not present

## 2020-09-02 DIAGNOSIS — D649 Anemia, unspecified: Secondary | ICD-10-CM | POA: Diagnosis not present

## 2020-09-02 LAB — BASIC METABOLIC PANEL
BUN: 45 — AB (ref 4–21)
CO2: 20 (ref 13–22)
Chloride: 107 (ref 99–108)
Creatinine: 2.1 — AB (ref 0.6–1.3)
Glucose: 95
Potassium: 4.4 (ref 3.4–5.3)
Sodium: 138 (ref 137–147)

## 2020-09-02 LAB — CBC AND DIFFERENTIAL
HCT: 45 (ref 41–53)
Hemoglobin: 15.3 (ref 13.5–17.5)
Neutrophils Absolute: 3.98
Platelets: 236 (ref 150–399)
WBC: 7.1

## 2020-09-02 LAB — HEPATIC FUNCTION PANEL
ALT: 32 (ref 10–40)
AST: 43 — AB (ref 14–40)
Alkaline Phosphatase: 79 (ref 25–125)
Bilirubin, Total: 0.8

## 2020-09-02 LAB — CBC: RBC: 5.61 — AB (ref 3.87–5.11)

## 2020-09-02 LAB — COMPREHENSIVE METABOLIC PANEL
Albumin: 4.6 (ref 3.5–5.0)
Calcium: 9.5 (ref 8.7–10.7)

## 2020-09-03 ENCOUNTER — Encounter: Payer: Self-pay | Admitting: Oncology

## 2020-09-03 ENCOUNTER — Telehealth: Payer: Self-pay | Admitting: Oncology

## 2020-09-03 NOTE — Telephone Encounter (Signed)
Scheduled appt per 8/25 los - mailed letter with appt date and time   

## 2020-09-04 ENCOUNTER — Encounter: Payer: Self-pay | Admitting: Oncology

## 2020-09-08 ENCOUNTER — Encounter: Payer: Self-pay | Admitting: Oncology

## 2020-09-09 ENCOUNTER — Telehealth: Payer: Self-pay

## 2020-09-09 NOTE — Telephone Encounter (Signed)
error 

## 2020-09-10 ENCOUNTER — Telehealth: Payer: Self-pay

## 2020-09-10 NOTE — Telephone Encounter (Signed)
-----   Message from Belva Chimes, LPN sent at [id number] 10:26 AM EDT ----- Regarding: FW: call pt  ----- Message ----- From: Derwood Kaplan, MD Sent: 09/08/2020   7:43 PM EDT To: Belva Chimes, LPN Subject: call pt                                        Tell him M protein is stable but his kidney function is worse, he looks dehydrated, needs more fluids

## 2020-09-10 NOTE — Telephone Encounter (Signed)
Notified of lab results. Instructed to drink more fluids. Patient states understanding

## 2020-09-16 ENCOUNTER — Telehealth: Payer: Self-pay

## 2020-09-16 NOTE — Telephone Encounter (Signed)
-----   Message from Derwood Kaplan, MD sent at 09/15/2020  7:11 PM EDT ----- Regarding: call pt Tell him the M protein remains the same, it's stable

## 2020-09-17 ENCOUNTER — Telehealth: Payer: Self-pay

## 2020-09-17 NOTE — Telephone Encounter (Addendum)
Pt returned call from last week. I notified him of below and he verbalized understanding.  ----- Message from Derwood Kaplan, MD sent at 09/15/2020  7:11 PM EDT ----- Regarding: call pt Tell him the M protein remains the same, it's stable

## 2020-09-20 ENCOUNTER — Telehealth: Payer: Self-pay

## 2020-09-20 NOTE — Telephone Encounter (Signed)
Angela,LPN, tried to call him last week, to notify him that Dr Hinton Rao states his M protein is stable. Pt notified and verbalized understanding.

## 2020-10-28 DIAGNOSIS — H1132 Conjunctival hemorrhage, left eye: Secondary | ICD-10-CM | POA: Diagnosis not present

## 2020-11-24 DIAGNOSIS — E1142 Type 2 diabetes mellitus with diabetic polyneuropathy: Secondary | ICD-10-CM | POA: Diagnosis not present

## 2020-11-24 DIAGNOSIS — I10 Essential (primary) hypertension: Secondary | ICD-10-CM | POA: Diagnosis not present

## 2020-11-24 DIAGNOSIS — Z23 Encounter for immunization: Secondary | ICD-10-CM | POA: Diagnosis not present

## 2020-11-24 DIAGNOSIS — E785 Hyperlipidemia, unspecified: Secondary | ICD-10-CM | POA: Diagnosis not present

## 2020-11-24 DIAGNOSIS — N183 Chronic kidney disease, stage 3 unspecified: Secondary | ICD-10-CM | POA: Diagnosis not present

## 2020-11-24 DIAGNOSIS — M1A9XX0 Chronic gout, unspecified, without tophus (tophi): Secondary | ICD-10-CM | POA: Diagnosis not present

## 2021-02-18 ENCOUNTER — Other Ambulatory Visit: Payer: Self-pay | Admitting: Oncology

## 2021-02-18 ENCOUNTER — Inpatient Hospital Stay: Payer: PPO | Attending: Oncology

## 2021-02-18 ENCOUNTER — Other Ambulatory Visit: Payer: Self-pay | Admitting: Hematology and Oncology

## 2021-02-18 ENCOUNTER — Other Ambulatory Visit: Payer: Self-pay

## 2021-02-18 DIAGNOSIS — D472 Monoclonal gammopathy: Secondary | ICD-10-CM

## 2021-02-18 LAB — CBC AND DIFFERENTIAL
HCT: 47 (ref 41–53)
Hemoglobin: 15.8 (ref 13.5–17.5)
Neutrophils Absolute: 4.31
Platelets: 225 (ref 150–399)
WBC: 7.3

## 2021-02-18 LAB — BASIC METABOLIC PANEL
BUN: 25 — AB (ref 4–21)
CO2: 23 — AB (ref 13–22)
Chloride: 107 (ref 99–108)
Creatinine: 1.7 — AB (ref 0.6–1.3)
Glucose: 101
Potassium: 4.6 (ref 3.4–5.3)
Sodium: 137 (ref 137–147)

## 2021-02-18 LAB — COMPREHENSIVE METABOLIC PANEL
Albumin: 4.1 (ref 3.5–5.0)
Calcium: 9.1 (ref 8.7–10.7)

## 2021-02-18 LAB — HEPATIC FUNCTION PANEL
ALT: 27 (ref 10–40)
AST: 42 — AB (ref 14–40)
Alkaline Phosphatase: 73 (ref 25–125)
Bilirubin, Total: 0.9

## 2021-02-18 LAB — CBC: RBC: 6.02 — AB (ref 3.87–5.11)

## 2021-02-19 LAB — IGG, IGA, IGM
IgA: 570 mg/dL — ABNORMAL HIGH (ref 61–437)
IgG (Immunoglobin G), Serum: 874 mg/dL (ref 603–1613)
IgM (Immunoglobulin M), Srm: 42 mg/dL (ref 20–172)

## 2021-02-21 LAB — PROTEIN ELECTROPHORESIS, SERUM
A/G Ratio: 1.1 (ref 0.7–1.7)
Albumin ELP: 3.6 g/dL (ref 2.9–4.4)
Alpha-1-Globulin: 0.2 g/dL (ref 0.0–0.4)
Alpha-2-Globulin: 0.9 g/dL (ref 0.4–1.0)
Beta Globulin: 1.1 g/dL (ref 0.7–1.3)
Gamma Globulin: 1.1 g/dL (ref 0.4–1.8)
Globulin, Total: 3.3 g/dL (ref 2.2–3.9)
M-Spike, %: 0.3 g/dL — ABNORMAL HIGH
Total Protein ELP: 6.9 g/dL (ref 6.0–8.5)

## 2021-02-28 NOTE — Progress Notes (Signed)
[facility] [place]  [address] [place],    [address] [phone]  Clinic Day:  03/04/2021  Referring physician: Nicoletta Dress, MD   This document serves as a record of services personally performed by Hosie Poisson, MD. It was created on their behalf by Curry,Lauren E, a trained medical scribe. The creation of this record is based on the scribe's personal observations and the provider's statements to them.  CHIEF COMPLAINT:  CC: History of stage III adenocarcinoma of the small bowel and MGUS  Current Treatment:  Surveillance   HISTORY OF PRESENT ILLNESS:  Anthony Le is a 66 y.o. 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  This was treated with surgery and adjuvant chemotherapy.  He has never had evidence of recurrence.  We had been following him yearly. He was referred in June 2016 because of a small monoclonal spike on serum protein electrophoresis, felt to represent monoclonal gammopathy of uncertain significance, IgA kappa.  This was 0.2 at that time, and has ranged from 0.2 to 0.4 for over 5 years.  The urine protein electrophoresis was negative for a monoclonal spike even though he had 3+ proteinuria.  He also has had and increase in the free kappa light chains.  He has had chronic microcytosis, without anemia.  Previous iron studies were normal.  He also has a history of pulmonary embolism, treated with Coumadin anticoagulation.  This is monitored by  Dr. Delena Bali.  We have been seeing him every 6 months to follow the MGUS.  He underwent a bone marrow aspirate and biopsy back in July 2021 which was negative for multiple myeloma.  He had been referred by Dr. Justin Mend to Dr. Demetria Pore, since neither of them realized that we have been following this for over 5 and 1/2 years.  He underwent colonoscopy in August 2021 with Dr. Lyndel Safe which was normal except for a 4 mm polyp of the ascending colon.   Surgical pathology confirmed a tubular adenoma, and repeat in 5 years was recommended.  INTERVAL HISTORY:  Anthony Le is here for routine follow up and states that he is on a spiritual fast. He continues to drink liquids, but plans to end his fast tonight. He states that he is doing well and denies complaints. Blood counts and chemistries are unremarkable except for a BUN of 25, a creatinine of 1.7, improved from 2.1. M-spike remains stable at 0.3. His  appetite is good. He denies fever, chills or other signs of infection.  He denies nausea, vomiting, bowel issues, or abdominal pain.  He denies sore throat, cough, dyspnea, or chest pain.  REVIEW OF SYSTEMS:  Review of Systems  Constitutional: Negative.  Negative for appetite change, chills, fatigue, fever and unexpected weight change.  HENT:  Negative.    Eyes: Negative.   Respiratory: Negative.  Negative for chest tightness, cough, hemoptysis, shortness of breath and wheezing.   Cardiovascular: Negative.  Negative for chest pain, leg swelling and palpitations.  Gastrointestinal: Negative.  Negative for abdominal distention, abdominal pain, blood in stool, constipation, diarrhea, nausea and vomiting.  Endocrine: Negative.   Genitourinary: Negative.  Negative for difficulty urinating, dysuria, frequency and hematuria.   Musculoskeletal: Negative.  Negative for arthralgias, back pain, flank pain, gait problem and myalgias.  Skin: Negative.   Neurological: Negative.  Negative for dizziness, extremity weakness, gait problem, headaches, light-headedness, numbness, seizures and speech difficulty.  Hematological: Negative.   Psychiatric/Behavioral: Negative.  Negative  for depression and sleep disturbance. The patient is not nervous/anxious.   All other systems reviewed and are negative.   VITALS:  Blood pressure (!) 141/86, pulse 76, temperature 98.1 F (36.7 C), temperature source Oral, height 6' 2"  (1.88 m), weight 297 lb 4.8 oz (134.9 kg), SpO2 97 %.   Wt Readings from Last 3 Encounters:  03/04/21 297 lb 4.8 oz (134.9 kg)  12/03/19 (!) 339 lb 8 oz (154 kg)  09/02/19 (!) 326 lb 3.2 oz (148 kg)    Body mass index is 38.17 kg/m.  Performance status (ECOG): 0 - Asymptomatic  PHYSICAL EXAM:  Physical Exam Constitutional:      General: He is not in acute distress.    Appearance: Normal appearance. He is normal weight.  HENT:     Head: Normocephalic and atraumatic.  Eyes:     General: No scleral icterus.    Extraocular Movements: Extraocular movements intact.     Conjunctiva/sclera: Conjunctivae normal.     Pupils: Pupils are equal, round, and reactive to light.  Cardiovascular:     Rate and Rhythm: Normal rate. Rhythm irregularly irregular.     Pulses: Normal pulses.     Heart sounds: Normal heart sounds. No murmur heard.   No friction rub. No gallop.  Pulmonary:     Effort: Pulmonary effort is normal. No respiratory distress.     Breath sounds: Normal breath sounds.  Abdominal:     General: Bowel sounds are normal. There is no distension.     Palpations: Abdomen is soft. There is no hepatomegaly, splenomegaly or mass.     Tenderness: There is no abdominal tenderness.     Comments: Deep scar in the mid lower abdomen which is well healed.  Musculoskeletal:        General: Normal range of motion.     Cervical back: Normal range of motion and neck supple.     Right lower leg: No edema.     Left lower leg: No edema.  Lymphadenopathy:     Cervical: No cervical adenopathy.  Skin:    General: Skin is warm and dry.  Neurological:     General: No focal deficit present.     Mental Status: He is alert and oriented to person, place, and time. Mental status is at baseline.  Psychiatric:        Mood and Affect: Mood normal.        Behavior: Behavior normal.        Thought Content: Thought content normal.        Judgment: Judgment normal.    LABS:   CBC Latest Ref Rng & Units 02/18/2021 09/02/2020 11/24/2019  WBC - 7.3 7.1 7.3   Hemoglobin 13.5 - 17.5 15.8 15.3 14.0  Hematocrit 41 - 53 47 45 41.2  Platelets 150 - 399 225 236 233   CMP Latest Ref Rng & Units 02/18/2021 09/02/2020 11/24/2019  Glucose 70 - 99 mg/dL - - 101(H)  BUN 4 - 21 25(A) 45(A) 21  Creatinine 0.6 - 1.3 1.7(A) 2.1(A) 1.65(H)  Sodium 137 - 147 137 138 141  Potassium 3.4 - 5.3 4.6 4.4 4.1  Chloride 99 - 108 107 107 108  CO2 13 - 22 23(A) 20 24  Calcium 8.7 - 10.7 9.1 9.5 8.9  Total Protein 6.5 - 8.1 g/dL - - 6.8  Total Bilirubin 0.3 - 1.2 mg/dL - - 0.3  Alkaline Phos 25 - 125 73 79 63  AST 14 - 40 42(A) 43(A)  23  ALT 10 - 40 27 32 20     Lab Results  Component Value Date   TOTALPROTELP 6.9 02/18/2021   ALBUMINELP 3.6 02/18/2021   A1GS 0.2 02/18/2021   A2GS 0.9 02/18/2021   BETS 1.1 02/18/2021   GAMS 1.1 02/18/2021   MSPIKE 0.3 (H) 02/18/2021   SPEI Comment 02/18/2021   No results found for: TIBC, FERRITIN, IRONPCTSAT Lab Results  Component Value Date   LDH 183 07/24/2019    STUDIES:  No results found.   Allergies: No Known Allergies  Current Medications: Current Outpatient Medications  Medication Sig Dispense Refill   metoprolol succinate (TOPROL-XL) 50 MG 24 hr tablet Take 50 mg by mouth daily.     allopurinol (ZYLOPRIM) 300 MG tablet Take 300 mg by mouth daily.     furosemide (LASIX) 40 MG tablet Take by mouth.     hydrocortisone (ANUSOL-HC) 2.5 % rectal cream Place 1 application rectally 2 (two) times daily. Hydrocortisone 2.5% cream rectal area twice daily after bowel movements for 10 days then as needed 30 g 0   Omega-3 1000 MG CAPS Take 1 g by mouth.     rosuvastatin (CRESTOR) 40 MG tablet Take 40 mg by mouth daily.     No current facility-administered medications for this visit.    ASSESSMENT & PLAN:   Assessment:   1. Remote history of small bowel adenocarcinoma.  He remains without symptoms of recurrent disease, diagnosed in September 2004.  He had his colonoscopy in August 2021 and repeat in 5 years was  recommended.  2. Monoclonal gammopathy of uncertain significance, IgA kappa.  This remains fairly stable at 0.3 g/dL.    3. Chronic kidney disease, improved.  4. Morbid obesity.    5.  Irregularly irregular rhythm. I advised that he follow up with Dr. Delena Bali for further evaluation. I am unsure if this is related to his spiritual fast. He is not in distress and vital signs are stable.  Plan: I advised that he follow up with Dr. Delena Bali regarding his pulse. Otherwise, as he is doing well and remains stable, I will see him back in 6 months with CBC, comprehensive metabolic profile, quantitative immunoglobulins, serum protein electrophoresis.  We will continue to draw labs 2 weeks prior to his appointments.  He understands and agrees with this plan of care.   I provided 15 minutes of face-to-face time during this this encounter and > 50% was spent counseling as documented under my assessment and plan.    Derwood Kaplan, MD [facility] AT [facility] [address] [place] [address] Dept: [phone] Dept Fax: [phone]   I, Rita Ohara, am acting as scribe for Derwood Kaplan, MD  I have reviewed this report as typed by the medical scribe, and it is complete and accurate.

## 2021-03-04 ENCOUNTER — Inpatient Hospital Stay (INDEPENDENT_AMBULATORY_CARE_PROVIDER_SITE_OTHER): Payer: PPO | Admitting: Oncology

## 2021-03-04 ENCOUNTER — Other Ambulatory Visit: Payer: Self-pay | Admitting: Oncology

## 2021-03-04 ENCOUNTER — Encounter: Payer: Self-pay | Admitting: Oncology

## 2021-03-04 ENCOUNTER — Other Ambulatory Visit: Payer: Self-pay

## 2021-03-04 VITALS — BP 141/86 | HR 76 | Temp 98.1°F | Ht 74.0 in | Wt 297.3 lb

## 2021-03-04 DIAGNOSIS — C179 Malignant neoplasm of small intestine, unspecified: Secondary | ICD-10-CM | POA: Diagnosis not present

## 2021-03-04 DIAGNOSIS — D472 Monoclonal gammopathy: Secondary | ICD-10-CM

## 2021-08-18 ENCOUNTER — Other Ambulatory Visit: Payer: PPO

## 2021-08-21 DIAGNOSIS — M25562 Pain in left knee: Secondary | ICD-10-CM | POA: Diagnosis not present

## 2021-08-21 DIAGNOSIS — M170 Bilateral primary osteoarthritis of knee: Secondary | ICD-10-CM | POA: Diagnosis not present

## 2021-08-21 DIAGNOSIS — M1711 Unilateral primary osteoarthritis, right knee: Secondary | ICD-10-CM | POA: Diagnosis not present

## 2021-08-21 DIAGNOSIS — Y999 Unspecified external cause status: Secondary | ICD-10-CM | POA: Diagnosis not present

## 2021-08-21 DIAGNOSIS — W010XXA Fall on same level from slipping, tripping and stumbling without subsequent striking against object, initial encounter: Secondary | ICD-10-CM | POA: Diagnosis not present

## 2021-08-21 DIAGNOSIS — M25561 Pain in right knee: Secondary | ICD-10-CM | POA: Diagnosis not present

## 2021-08-31 ENCOUNTER — Other Ambulatory Visit: Payer: Self-pay

## 2021-09-01 ENCOUNTER — Ambulatory Visit: Payer: PPO | Admitting: Hematology and Oncology

## 2021-09-06 DIAGNOSIS — M170 Bilateral primary osteoarthritis of knee: Secondary | ICD-10-CM | POA: Diagnosis not present

## 2021-11-13 DIAGNOSIS — E785 Hyperlipidemia, unspecified: Secondary | ICD-10-CM | POA: Diagnosis not present

## 2021-11-13 DIAGNOSIS — R9431 Abnormal electrocardiogram [ECG] [EKG]: Secondary | ICD-10-CM | POA: Diagnosis not present

## 2021-11-13 DIAGNOSIS — I5020 Unspecified systolic (congestive) heart failure: Secondary | ICD-10-CM | POA: Diagnosis not present

## 2021-11-13 DIAGNOSIS — I491 Atrial premature depolarization: Secondary | ICD-10-CM | POA: Diagnosis not present

## 2021-11-13 DIAGNOSIS — I493 Ventricular premature depolarization: Secondary | ICD-10-CM | POA: Diagnosis not present

## 2021-11-13 DIAGNOSIS — E1122 Type 2 diabetes mellitus with diabetic chronic kidney disease: Secondary | ICD-10-CM | POA: Diagnosis not present

## 2021-11-13 DIAGNOSIS — I130 Hypertensive heart and chronic kidney disease with heart failure and stage 1 through stage 4 chronic kidney disease, or unspecified chronic kidney disease: Secondary | ICD-10-CM | POA: Diagnosis not present

## 2021-11-13 DIAGNOSIS — I2699 Other pulmonary embolism without acute cor pulmonale: Secondary | ICD-10-CM | POA: Diagnosis not present

## 2021-11-13 DIAGNOSIS — N183 Chronic kidney disease, stage 3 unspecified: Secondary | ICD-10-CM | POA: Diagnosis not present

## 2021-11-13 DIAGNOSIS — I2694 Multiple subsegmental pulmonary emboli without acute cor pulmonale: Secondary | ICD-10-CM | POA: Diagnosis not present

## 2021-11-13 DIAGNOSIS — M109 Gout, unspecified: Secondary | ICD-10-CM | POA: Diagnosis not present

## 2021-11-13 DIAGNOSIS — N179 Acute kidney failure, unspecified: Secondary | ICD-10-CM | POA: Diagnosis not present

## 2021-11-13 DIAGNOSIS — I371 Nonrheumatic pulmonary valve insufficiency: Secondary | ICD-10-CM | POA: Diagnosis not present

## 2021-11-13 DIAGNOSIS — I5022 Chronic systolic (congestive) heart failure: Secondary | ICD-10-CM | POA: Diagnosis not present

## 2021-11-13 DIAGNOSIS — G4733 Obstructive sleep apnea (adult) (pediatric): Secondary | ICD-10-CM | POA: Diagnosis not present

## 2021-11-13 DIAGNOSIS — Z86711 Personal history of pulmonary embolism: Secondary | ICD-10-CM | POA: Diagnosis not present

## 2021-11-13 DIAGNOSIS — R579 Shock, unspecified: Secondary | ICD-10-CM | POA: Diagnosis not present

## 2021-11-13 DIAGNOSIS — Z8249 Family history of ischemic heart disease and other diseases of the circulatory system: Secondary | ICD-10-CM | POA: Diagnosis not present

## 2021-11-13 DIAGNOSIS — R0602 Shortness of breath: Secondary | ICD-10-CM | POA: Diagnosis not present

## 2021-11-13 DIAGNOSIS — Z800 Family history of malignant neoplasm of digestive organs: Secondary | ICD-10-CM | POA: Diagnosis not present

## 2021-11-13 DIAGNOSIS — N189 Chronic kidney disease, unspecified: Secondary | ICD-10-CM | POA: Diagnosis not present

## 2021-11-13 DIAGNOSIS — I428 Other cardiomyopathies: Secondary | ICD-10-CM | POA: Diagnosis not present

## 2021-11-13 DIAGNOSIS — I129 Hypertensive chronic kidney disease with stage 1 through stage 4 chronic kidney disease, or unspecified chronic kidney disease: Secondary | ICD-10-CM | POA: Diagnosis not present

## 2021-11-13 DIAGNOSIS — R000 Tachycardia, unspecified: Secondary | ICD-10-CM | POA: Diagnosis not present

## 2021-11-13 DIAGNOSIS — E876 Hypokalemia: Secondary | ICD-10-CM | POA: Diagnosis not present

## 2021-11-13 DIAGNOSIS — Z85068 Personal history of other malignant neoplasm of small intestine: Secondary | ICD-10-CM | POA: Diagnosis not present

## 2021-11-13 DIAGNOSIS — I8290 Acute embolism and thrombosis of unspecified vein: Secondary | ICD-10-CM | POA: Diagnosis not present

## 2021-11-17 DIAGNOSIS — N183 Chronic kidney disease, stage 3 unspecified: Secondary | ICD-10-CM | POA: Diagnosis not present

## 2021-11-17 DIAGNOSIS — Z23 Encounter for immunization: Secondary | ICD-10-CM | POA: Diagnosis not present

## 2021-11-17 DIAGNOSIS — I2699 Other pulmonary embolism without acute cor pulmonale: Secondary | ICD-10-CM | POA: Diagnosis not present

## 2021-12-15 DIAGNOSIS — I2699 Other pulmonary embolism without acute cor pulmonale: Secondary | ICD-10-CM | POA: Diagnosis not present

## 2021-12-15 DIAGNOSIS — N183 Chronic kidney disease, stage 3 unspecified: Secondary | ICD-10-CM | POA: Diagnosis not present

## 2021-12-15 DIAGNOSIS — M1A9XX0 Chronic gout, unspecified, without tophus (tophi): Secondary | ICD-10-CM | POA: Diagnosis not present

## 2021-12-15 DIAGNOSIS — I10 Essential (primary) hypertension: Secondary | ICD-10-CM | POA: Diagnosis not present

## 2021-12-15 DIAGNOSIS — E785 Hyperlipidemia, unspecified: Secondary | ICD-10-CM | POA: Diagnosis not present

## 2021-12-15 DIAGNOSIS — E1142 Type 2 diabetes mellitus with diabetic polyneuropathy: Secondary | ICD-10-CM | POA: Diagnosis not present

## 2021-12-16 DIAGNOSIS — I2699 Other pulmonary embolism without acute cor pulmonale: Secondary | ICD-10-CM | POA: Diagnosis not present

## 2021-12-16 DIAGNOSIS — I82401 Acute embolism and thrombosis of unspecified deep veins of right lower extremity: Secondary | ICD-10-CM | POA: Diagnosis not present

## 2021-12-16 DIAGNOSIS — Z6841 Body Mass Index (BMI) 40.0 and over, adult: Secondary | ICD-10-CM | POA: Diagnosis not present

## 2022-03-21 DIAGNOSIS — M7989 Other specified soft tissue disorders: Secondary | ICD-10-CM | POA: Diagnosis not present

## 2022-03-21 DIAGNOSIS — I129 Hypertensive chronic kidney disease with stage 1 through stage 4 chronic kidney disease, or unspecified chronic kidney disease: Secondary | ICD-10-CM | POA: Diagnosis not present

## 2022-03-21 DIAGNOSIS — T39395A Adverse effect of other nonsteroidal anti-inflammatory drugs [NSAID], initial encounter: Secondary | ICD-10-CM | POA: Diagnosis not present

## 2022-03-21 DIAGNOSIS — N1419 Nephropathy induced by other drugs, medicaments and biological substances: Secondary | ICD-10-CM | POA: Diagnosis not present

## 2022-03-21 DIAGNOSIS — R801 Persistent proteinuria, unspecified: Secondary | ICD-10-CM | POA: Diagnosis not present

## 2022-03-21 DIAGNOSIS — N183 Chronic kidney disease, stage 3 unspecified: Secondary | ICD-10-CM | POA: Diagnosis not present

## 2022-03-21 DIAGNOSIS — R3129 Other microscopic hematuria: Secondary | ICD-10-CM | POA: Diagnosis not present

## 2022-04-14 DIAGNOSIS — E1142 Type 2 diabetes mellitus with diabetic polyneuropathy: Secondary | ICD-10-CM | POA: Diagnosis not present

## 2022-04-14 DIAGNOSIS — E785 Hyperlipidemia, unspecified: Secondary | ICD-10-CM | POA: Diagnosis not present

## 2022-04-14 DIAGNOSIS — M1A9XX0 Chronic gout, unspecified, without tophus (tophi): Secondary | ICD-10-CM | POA: Diagnosis not present

## 2022-04-14 DIAGNOSIS — Z125 Encounter for screening for malignant neoplasm of prostate: Secondary | ICD-10-CM | POA: Diagnosis not present

## 2022-04-14 DIAGNOSIS — I10 Essential (primary) hypertension: Secondary | ICD-10-CM | POA: Diagnosis not present

## 2022-04-14 DIAGNOSIS — N183 Chronic kidney disease, stage 3 unspecified: Secondary | ICD-10-CM | POA: Diagnosis not present

## 2022-04-19 DIAGNOSIS — I10 Essential (primary) hypertension: Secondary | ICD-10-CM | POA: Diagnosis not present

## 2022-04-19 DIAGNOSIS — R0602 Shortness of breath: Secondary | ICD-10-CM | POA: Diagnosis not present

## 2022-04-19 DIAGNOSIS — R059 Cough, unspecified: Secondary | ICD-10-CM | POA: Diagnosis not present

## 2022-04-19 DIAGNOSIS — J209 Acute bronchitis, unspecified: Secondary | ICD-10-CM | POA: Diagnosis not present

## 2022-04-19 DIAGNOSIS — Z6839 Body mass index (BMI) 39.0-39.9, adult: Secondary | ICD-10-CM | POA: Diagnosis not present

## 2022-04-21 DIAGNOSIS — J209 Acute bronchitis, unspecified: Secondary | ICD-10-CM | POA: Diagnosis not present

## 2022-04-21 DIAGNOSIS — J208 Acute bronchitis due to other specified organisms: Secondary | ICD-10-CM | POA: Diagnosis not present

## 2022-06-26 DIAGNOSIS — N183 Chronic kidney disease, stage 3 unspecified: Secondary | ICD-10-CM | POA: Diagnosis not present

## 2022-06-26 DIAGNOSIS — I129 Hypertensive chronic kidney disease with stage 1 through stage 4 chronic kidney disease, or unspecified chronic kidney disease: Secondary | ICD-10-CM | POA: Diagnosis not present

## 2022-06-27 DIAGNOSIS — E559 Vitamin D deficiency, unspecified: Secondary | ICD-10-CM | POA: Diagnosis not present

## 2022-06-27 DIAGNOSIS — N183 Chronic kidney disease, stage 3 unspecified: Secondary | ICD-10-CM | POA: Diagnosis not present

## 2022-06-27 DIAGNOSIS — M7989 Other specified soft tissue disorders: Secondary | ICD-10-CM | POA: Diagnosis not present

## 2022-06-27 DIAGNOSIS — T39395A Adverse effect of other nonsteroidal anti-inflammatory drugs [NSAID], initial encounter: Secondary | ICD-10-CM | POA: Diagnosis not present

## 2022-06-27 DIAGNOSIS — R801 Persistent proteinuria, unspecified: Secondary | ICD-10-CM | POA: Diagnosis not present

## 2022-06-27 DIAGNOSIS — N1419 Nephropathy induced by other drugs, medicaments and biological substances: Secondary | ICD-10-CM | POA: Diagnosis not present

## 2022-06-27 DIAGNOSIS — I129 Hypertensive chronic kidney disease with stage 1 through stage 4 chronic kidney disease, or unspecified chronic kidney disease: Secondary | ICD-10-CM | POA: Diagnosis not present

## 2022-06-27 DIAGNOSIS — M898X9 Other specified disorders of bone, unspecified site: Secondary | ICD-10-CM | POA: Diagnosis not present

## 2022-07-13 IMAGING — DX DG BONE SURVEY MET
9 of 10 series · 9 of 10 positions shown · non-contrast
Comparison: CT 09/23/2012.

CLINICAL DATA: MGUS.

EXAM:
METASTATIC BONE SURVEY

[skull lat]
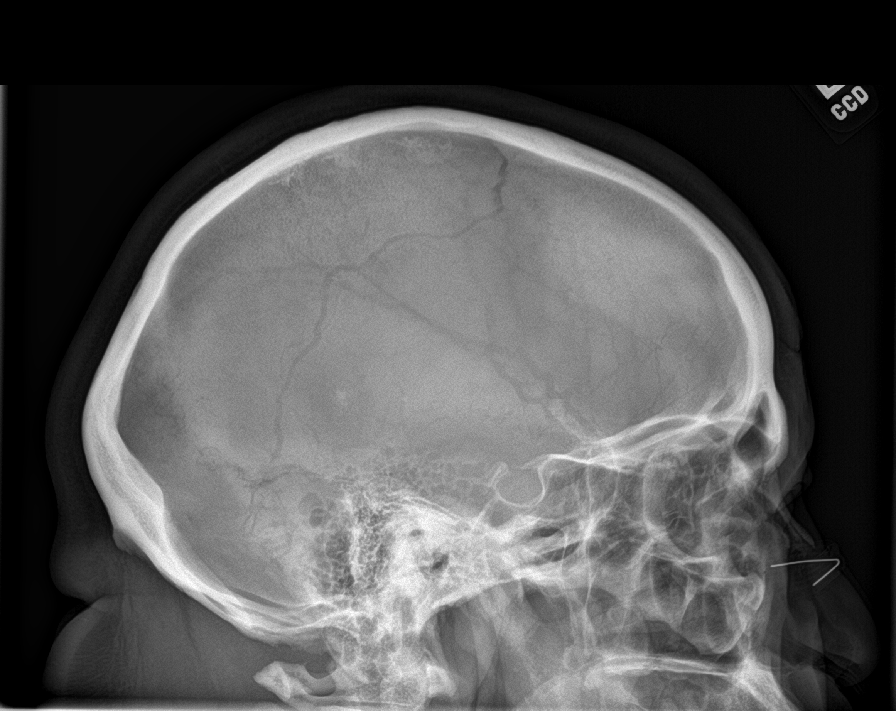

[shoulder ap (1 of 2)]
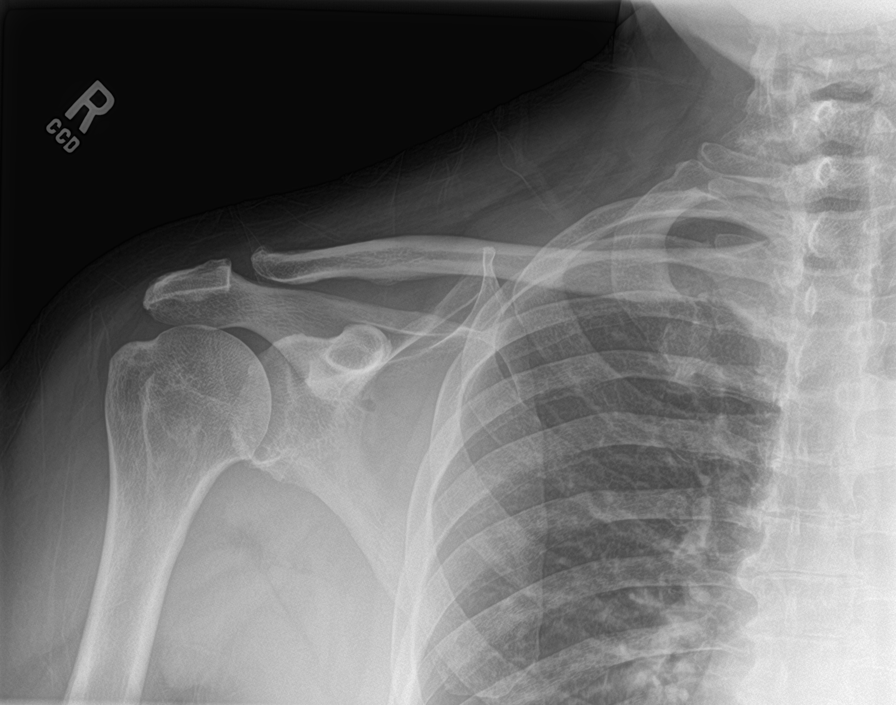

[shoulder ap (2 of 2)]
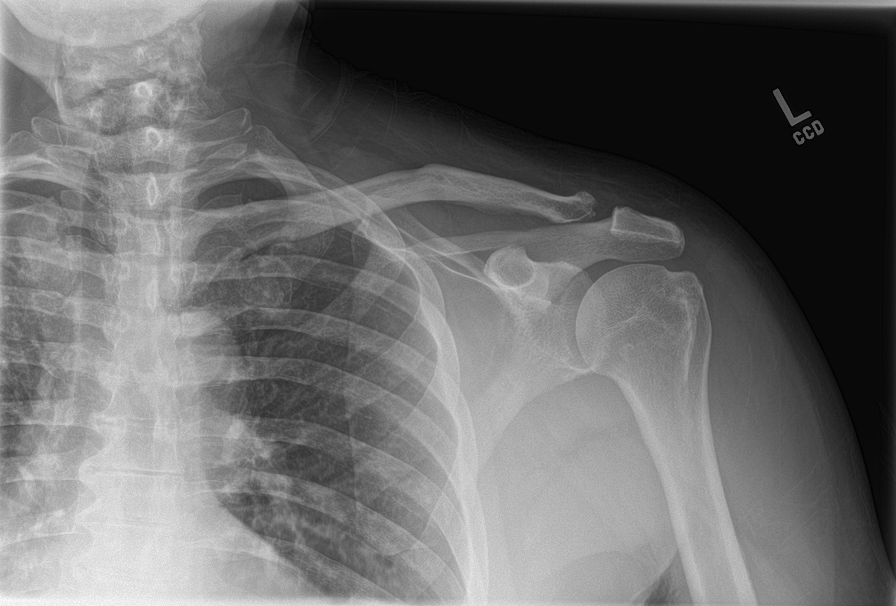

[humerus ap (1 of 2)]
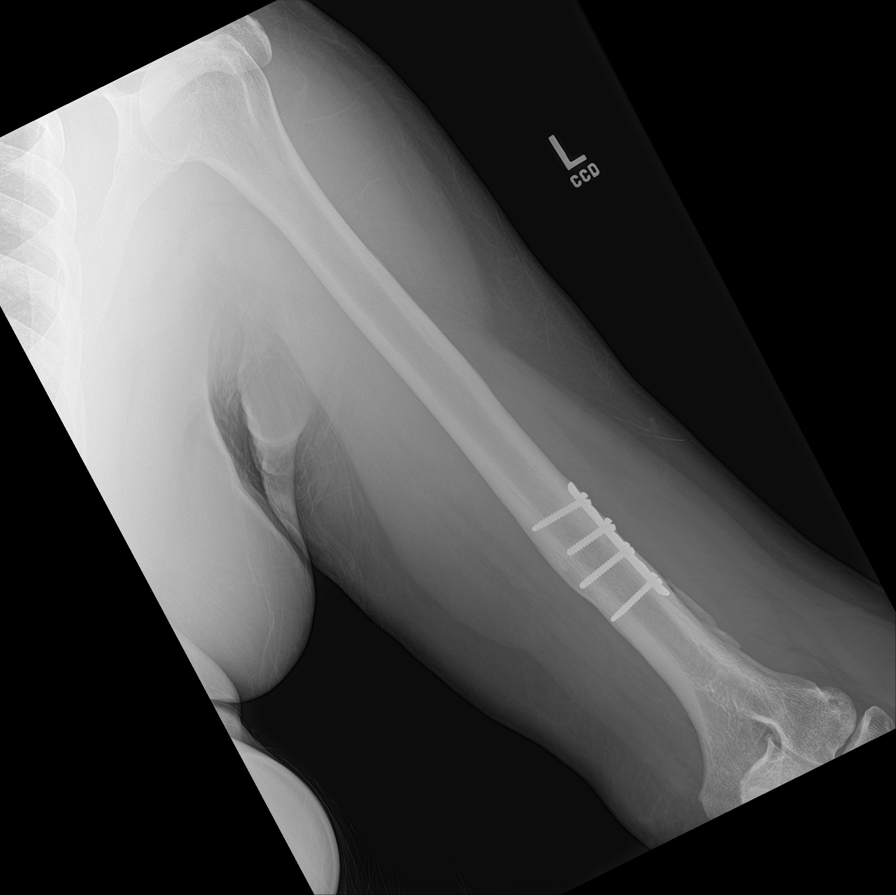

[humerus ap (2 of 2)]
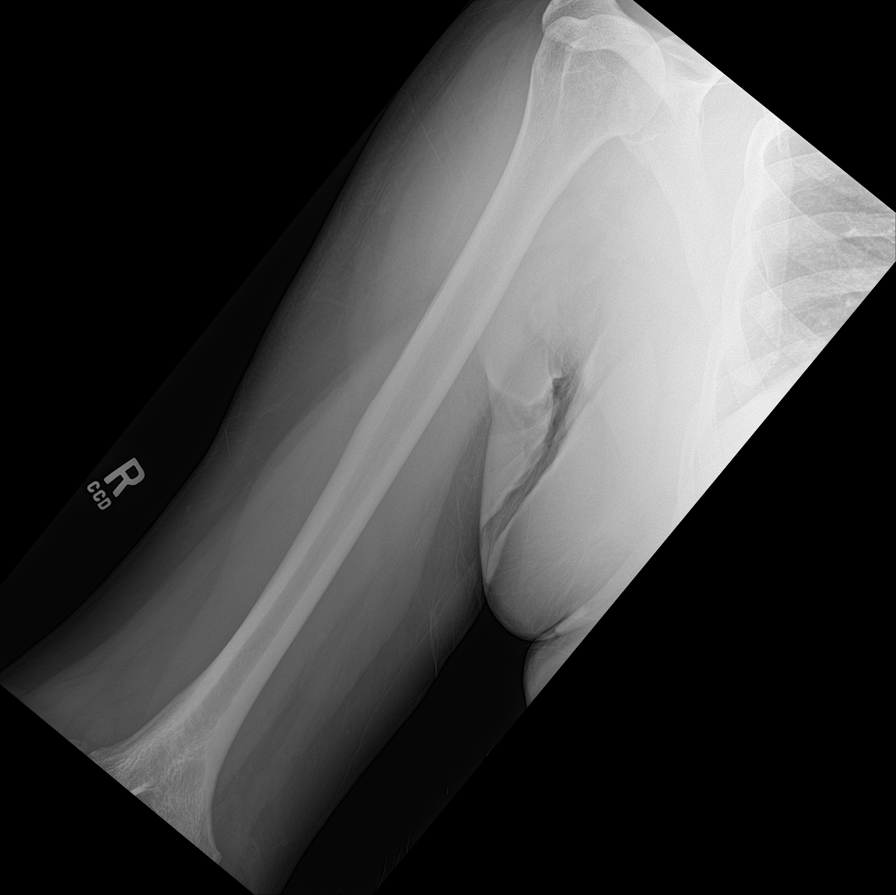

[forearm ap (1 of 2)]
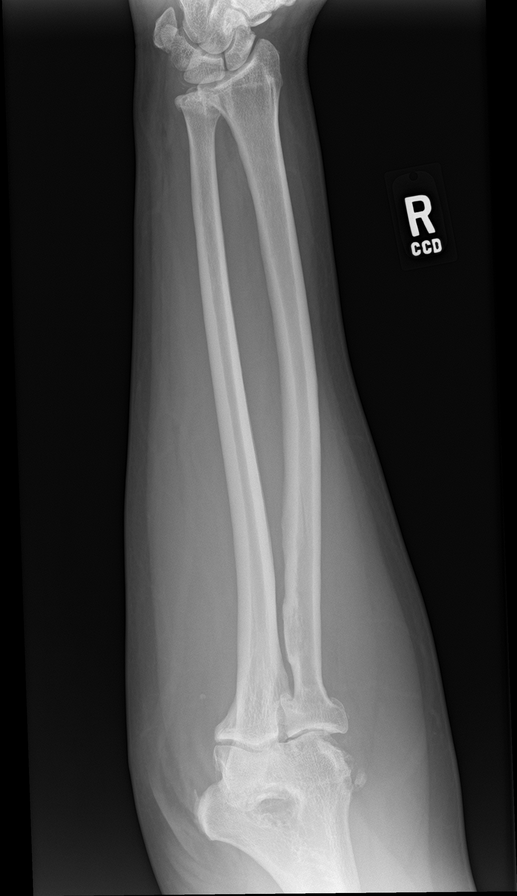

[forearm ap (2 of 2)]
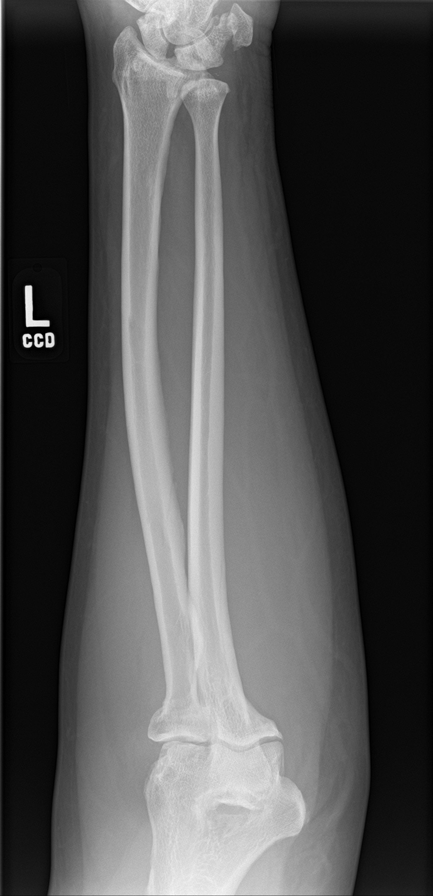

[c-spine ap]
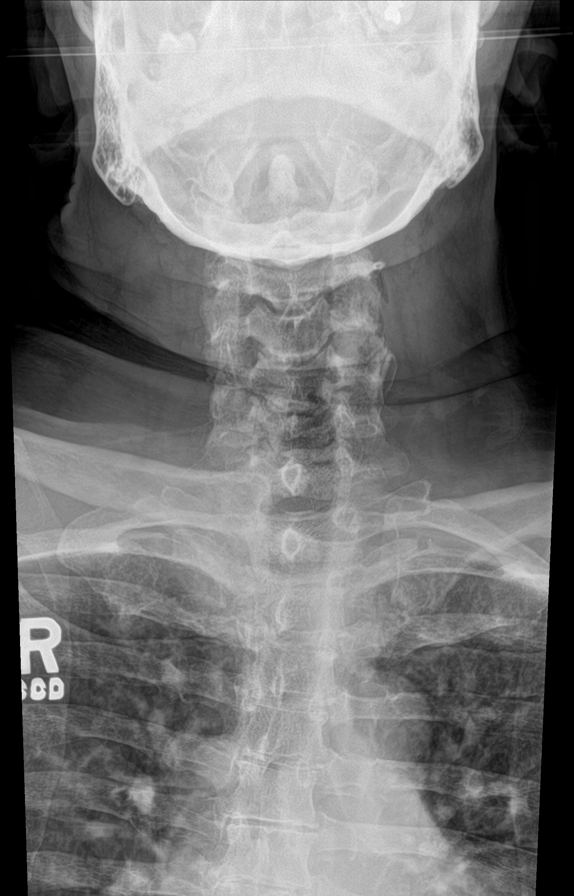

[c-spine lat]
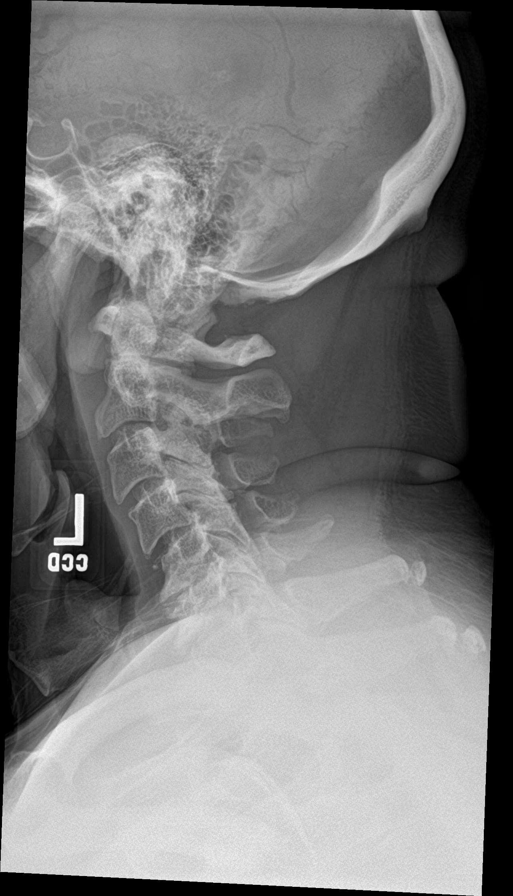

[9 of 10 positions shown; findings below may reference images not displayed]

FINDINGS: Standard imaging of the axial and appendicular skeleton obtained.
Skull appears unremarkable. Punctate corticated lucency noted over
the right scapula unchanged from prior CT of 09/23/2012 and most
likely a simple cyst. Degenerative changes right shoulder.
Degenerative changes left shoulder. Postsurgical changes left
humerus. Severe degenerative changes noted throughout the entire
spine. No acute cardiopulmonary disease noted on chest x-ray.
Findings suggesting avascular necrosis of both distal femurs. Severe
degenerative changes both knees. Prominent exostosis/soft tissue
calcification about the left knee. No focal bony lesions identified
to suggest myeloma or metastatic disease.
IMPRESSION: No evidence of myeloma or metastatic disease.

## 2022-07-17 ENCOUNTER — Telehealth: Payer: Self-pay | Admitting: Hematology and Oncology

## 2022-07-17 ENCOUNTER — Other Ambulatory Visit: Payer: Self-pay | Admitting: Hematology and Oncology

## 2022-07-17 ENCOUNTER — Other Ambulatory Visit: Payer: PPO

## 2022-07-17 ENCOUNTER — Ambulatory Visit: Payer: PPO | Admitting: Hematology and Oncology

## 2022-07-17 DIAGNOSIS — D472 Monoclonal gammopathy: Secondary | ICD-10-CM

## 2022-07-17 DIAGNOSIS — C179 Malignant neoplasm of small intestine, unspecified: Secondary | ICD-10-CM

## 2022-07-17 NOTE — Progress Notes (Deleted)
[facility]  [address] [place],  [place]  [address] [phone]  Clinic Day:  07/17/2022  Referring physician: Paulina Fusi, MD   CHIEF COMPLAINT:  CC: ***  Current Treatment:  ***  HISTORY OF PRESENT ILLNESS:  Anthony Le is a 67 y.o. male with a history of ***.  Oncology History  Small bowel cancer (HCC)  09/13/2002 Cancer Staging   Staging form: Small Intestine, AJCC 6th Edition - Clinical stage from 09/13/2002: Stage II (T3, N0, M0) - Signed by Dellia Beckwith, MD on 03/04/2021 Staged by: Managing physician Diagnostic confirmation: Positive histology Specimen type: Excision Histopathologic type: Adenocarcinoma, intestinal type Total positive nodes: 0 Stage prefix: Initial diagnosis Lymphatic vessel invasion (L): L0 - No lymphatic vessel invasion Venous invasion (V): V0 - No venous invasion Prognostic indicators: Treated with surgery and adjuvant chemotherapy, incl doxorubicin   10/03/2002 Initial Diagnosis   Small bowel cancer (HCC)       INTERVAL HISTORY:  Anthony Le is here today for repeat clinical assessment. He denies fevers or chills. He denies pain. His appetite is good. His weight {Weight change:10426}.  REVIEW OF SYSTEMS:  Review of Systems - Oncology   VITALS:  There were no vitals taken for this visit.  Wt Readings from Last 3 Encounters:  03/04/21 297 lb 4.8 oz (134.9 kg)  12/03/19 (!) 339 lb 8 oz (154 kg)  09/02/19 (!) 326 lb 3.2 oz (148 kg)    There is no height or weight on file to calculate BMI.  Performance status (ECOG): {CHL ONC [id number]  PHYSICAL EXAM:  Physical Exam  LABS:      Latest Ref Rng & Units 02/18/2021   12:00 AM 09/02/2020   12:00 AM 11/24/2019    1:43 PM  CBC  WBC  7.3     7.1  7.3   Hemoglobin 13.5 - 17.5 15.8     15.3  14.0   Hematocrit 41 - 53 47     45  41.2   Platelets 150 - 399 225     236  233      This result is from an external source.      Latest  Ref Rng & Units 02/18/2021   12:00 AM 09/02/2020   12:00 AM 11/24/2019    1:43 PM  CMP  Glucose 70 - 99 mg/dL   329   BUN 4 - 21 25     45  21   Creatinine 0.6 - 1.3 1.7     2.1  1.65   Sodium 137 - 147 137     138  141   Potassium 3.4 - 5.3 4.6     4.4  4.1   Chloride 99 - 108 107     107  108   CO2 13 - 22 23     20  24    Calcium 8.7 - 10.7 9.1     9.5  8.9   Total Protein 6.5 - 8.1 g/dL   6.8   Total Bilirubin 0.3 - 1.2 mg/dL   0.3   Alkaline Phos 25 - 125 73     79  63   AST 14 - 40 42     43  23   ALT 10 - 40 27     32  20      This result is from an external source.     No results found for: "CEA1", "CEA" / No results found  for: "CEA1", "CEA" No results found for: "PSA1" No results found for: "CAN199" No results found for: "CAN125"  Lab Results  Component Value Date   TOTALPROTELP 6.9 02/18/2021   ALBUMINELP 3.6 02/18/2021   A1GS 0.2 02/18/2021   A2GS 0.9 02/18/2021   BETS 1.1 02/18/2021   GAMS 1.1 02/18/2021   MSPIKE 0.3 (H) 02/18/2021   SPEI Comment 02/18/2021   No results found for: "TIBC", "FERRITIN", "IRONPCTSAT" Lab Results  Component Value Date   LDH 183 07/24/2019    STUDIES:  No results found.    HISTORY:   Past Medical History:  Diagnosis Date   Allergy    sinus problems   Anemia    Arthritis    Blood transfusion without reported diagnosis 2005   Cancer [facility]) 2005   Small bowel Cancer   Clotting disorder (HCC) 2016   Depression    Diabetes mellitus without complication (HCC)    controlled by diet.   Diverticula, colon    Fatigue    Gout    history of gout   Hyperlipidemia    Hypertension    Memory loss    Muscle pain    and cramping   Osteoporosis    pt denies hx of this.   Sleep apnea    is not on a Cpap    Past Surgical History:  Procedure Laterality Date   bowel removal     CARPAL TUNNEL WITH CUBITAL TUNNEL     COLONOSCOPY  2016   left arm surgery     LEG SURGERY     NOSE SURGERY     SMALL INTESTINE SURGERY      cancer removed and resectioned    Family History  Problem Relation Age of Onset   Stroke Mother    Arthritis Mother    Diabetes Mother    Hypertension Mother    Cancer Father    Stroke Father    Hypertension Father    Gout Father    Colon cancer Father 83   Esophageal cancer Neg Hx    Rectal cancer Neg Hx    Stomach cancer Neg Hx     Social History:  reports that he has quit smoking. He has never used smokeless tobacco. He reports that he does not drink alcohol and does not use drugs.The patient is {Blank single:19197::"alone","accompanied by"} *** today.  Allergies: No Known Allergies  Current Medications: Current Outpatient Medications  Medication Sig Dispense Refill   allopurinol (ZYLOPRIM) 300 MG tablet Take 300 mg by mouth daily.     amLODipine (NORVASC) 5 MG tablet Take 5 mg by mouth every morning.     diclofenac Sodium (VOLTAREN) 1 % GEL Use 2-3 times a day as needed for pain.     Febuxostat 80 MG TABS Take 1 tablet by mouth daily.     furosemide (LASIX) 20 MG tablet Take by mouth.     hydrocortisone (ANUSOL-HC) 2.5 % rectal cream Place 1 application rectally 2 (two) times daily. Hydrocortisone 2.5% cream rectal area twice daily after bowel movements for 10 days then as needed 30 g 0   metoprolol tartrate (LOPRESSOR) 100 MG tablet Take by mouth.     Omega-3 1000 MG CAPS Take by mouth.     rosuvastatin (CRESTOR) 40 MG tablet Take by mouth.     No current facility-administered medications for this visit.     ASSESSMENT & PLAN:   Assessment & Plan: [name] is a 67 y.o. male with ***.  The patient understands the plans discussed today and is in agreement with them.  He knows to contact our office if he develops concerns prior to his next appointment.     I provided *** minutes of face-to-face time during this encounter and > 50% was spent counseling as documented under my assessment and plan.    Adah Perl, PA-C  [facility] AT [facility] [address] [place] [address] Dept: [phone] Dept Fax: [phone]   No orders of the defined types were placed in this encounter.

## 2022-07-17 NOTE — Telephone Encounter (Signed)
07/17/22 Spoke with patient and rescheduled appt

## 2022-07-18 DIAGNOSIS — R269 Unspecified abnormalities of gait and mobility: Secondary | ICD-10-CM | POA: Diagnosis not present

## 2022-07-18 NOTE — Progress Notes (Signed)
[facility]  [address] [place],  [place]  [address] [phone]  Clinic Day:  07/19/2022  Referring physician: Paulina Fusi, MD   CHIEF COMPLAINT:  CC:  1. Remote history of stage III adenocarcinoma of the bowel  2. MGUS  3. Microcytosis  Current Treatment: Surveillance  HISTORY OF PRESENT ILLNESS:  Anthony Le is a 67 y.o. 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  This was treated with surgery and adjuvant chemotherapy.  He has never had evidence of recurrence.  We had been following him yearly.   He was referred back in June 2016 because of a small monoclonal spike on serum protein electrophoresis, felt to represent monoclonal gammopathy of uncertain significance, IgA kappa, originally noted in June 2016.  The monoclonal spike has ranged from 0.2 to 0.4 for 6 years.  The urine protein electrophoresis was negative for a monoclonal spike even though he had 3+ proteinuria.  He also has had increased free kappa and lambda light chains.  He also has had chronic microcytosis, without anemia.  Previous iron studies were normal.  I do not believe he has had hemoglobin electrophoresis.  He also has a history of pulmonary embolism in September 2014 treated with warfarin, which was monitored by Dr. Tomasa Blase.  Warfarin was subsequently discontinued due to hemorrhoidal bleeding.  CTA at [facility] ER revealed significant bilateral pulmonary embolism with saddle component of clot and moderate-to-large overall pulmonary arterial thrombus burden. There was evidence of probable right heart strain.  Due to this, he was transferred [facility].  We had been seeing him every 6 months to follow the MGUS.  He underwent a bone marrow aspirate and biopsy back in July 2021, which was negative for multiple myeloma.  He had been referred by Dr. Hyman Hopes to Dr. Mauri Brooklyn, since neither of them realized that we were  following the MGUS.  He underwent colonoscopy in August 2021 with Dr. Chales Abrahams which was normal except for a 4 mm polyp of the ascending colon.  Surgical pathology revealed a tubular adenoma, and repeat in 5 years was recommended.  Oncology History  Small bowel cancer (HCC)  09/13/2002 Cancer Staging   Staging form: Small Intestine, AJCC 6th Edition - Clinical stage from 09/13/2002: Stage II (T3, N0, M0) - Signed by Dellia Beckwith, MD on 03/04/2021 Staged by: Managing physician Diagnostic confirmation: Positive histology Specimen type: Excision Histopathologic type: Adenocarcinoma, intestinal type Total positive nodes: 0 Stage prefix: Initial diagnosis Lymphatic vessel invasion (L): L0 - No lymphatic vessel invasion Venous invasion (V): V0 - No venous invasion Prognostic indicators: Treated with surgery and adjuvant chemotherapy, incl doxorubicin   10/03/2002 Initial Diagnosis   Small bowel cancer (HCC)       INTERVAL HISTORY:  Anthony Le is here today for repeat clinical assessment.  He was last seen in February 2023, having missed an appointment in August 2023.  He states he was told not to come by our office.  He states he was admitted to [facility] due to shortness of breath before Christmas 2023 and had bad blood clot in his lungs.  Review of his records reveals he was admitted 11/13/2021 with complaint of shortness of breath. He also reported lower extremity swelling that began approximately 1 week prior to admission.  On admission, patient underwent CTA which demonstrated large volume bilateral pulmonary emboli including distal right main pulmonary artery with large clot burden appreciated. CT was  also positive for right heart strain which was consistent with submassive PE. Patient was started on heparin drip and admitted to the ICU for close monitoring. Cardiology was consulted and recommended medical management with heparin drip.  Was also found to have a DVT of the left lower  extremity.  Troponins were elevated.  Echo demonstrated LVEF 50 to 55%, with mild LVH noted. RV was noted to be normal in size and function without any additional evidence of right heart strain.  The patient was also noted to have acute kidney injury on presentation, with serum creatinine of 1.95 on admission over baseline of 1.5-1.7. Serum creatinine down trended to patient's baseline during the course of his admission and was at baseline on the day of his discharge.  Cardiology was again consulted on the morning of patient's discharge and recommended he be discharged on Eliquis.  He was discharged home on 11/16/2021 with prescription for Eliquis VTE starter pack.  He states he has been following with Dr. Tomasa Blase regarding this since that time.  There was no obvious provocation for this episode of PE/DVT.  Now that we have access to Epic, I can see he was also found to have a left lower extremity deep venous thrombosis in September 2014.  His Port-A-Cath was removed.  He had an IVC filter placed.  TTE revealed EF 55%, mild RV dilation w/ normal RV pressures.  DVT and PE were felt to be most likely provoked due to recent arm injury, indwelling venous access device, and testosterone use. The patient had never smoked.  He was treated with Lovenox and transitioned to warfarin. However, patient reported his brother had a pulmonary embolism at age 67.  He developed a postop hematoma/abscess of the chest wall requiring incision and drainage.  He had removal of the IVC filter in January 2015.  In March 2023, he underwent cholecystectomy for acute cholecystitis [facility] while visiting.  Pathology revealed chronic active cholecystitis. Extensive recent hemorrhages. Adenomyomatosis.  Cholelithiasis.  Due to that month, he was admitted to [facility] with acute respiratory failure secondary to COVID-19 with superimposed bacterial pneumonia.  He did not have recurrent pulmonary emboli at that time.  He was treated with  antibiotics, remdesivir and dexamethasone. He required 3L O2 which was weaned to room air by the time of discharge. He was also felt to have acute HFrEF and treated with diuretic therapy with plans for outpatient cardiology follow up for cath given concern for ischemia. He also developed a non-anion gap metabolic acidosis and was started on sodium bicarbonate tabs. His hospital course was complicated by hyperglycemia related to steroid use. He was treated with insulin glargine, though this was not continued on discharge as steroids were not prescribed for outpatient use.  He has been followed by cardiology at [facility].  The patient states he has  been doing well.  He states he is only taking Eliquis twice daily.  He denies continued shortness of breath.  He had a chest x-ray in April due to acute bronchitis, which did not reveal any active or new pulmonary disease.  He states he has bilateral lower extremity edema, for which he is on torsemide 20 mg daily.  He states he takes a quarter of a tab twice daily.  He has occasional diarrhea, which is not severe.  He denies nausea vomiting or change in his stool.  He denies melena or hematochezia.  He denies abdominal pain.  He denies fevers or chills. He denies pain. His appetite is  good. His weight has decreased 15 pounds over last 16 months .  He sees Dr. Lisette Abu, nephrologist at [facility].  At his visit in June, labs revealed BUN/creatinine 27/1.6, EGFR 47 ml/min.  Electrolytes were within normal limits. Vitamin D 20, PTH 132.  He had been on losartan 50 mg, so this was increased to twice daily he was started on vitamin D 5000 international units daily.  Medical history is significant for progressive CKD stage III, uncontrolled hypertension/hyperlipidemia, borderline diabetes, history of gout, and severe bilateral knee osteoarthritis, as well as NSAID induced GI bleed.  REVIEW OF SYSTEMS:  Review of Systems  Constitutional:  Negative for appetite change,  chills, fatigue, fever and unexpected weight change.  HENT:   Negative for lump/mass, mouth sores and sore throat.   Respiratory:  Negative for cough and shortness of breath.   Cardiovascular:  Negative for chest pain and leg swelling.  Gastrointestinal:  Negative for abdominal pain, constipation, diarrhea, nausea and vomiting.  Genitourinary:  Negative for difficulty urinating, dysuria, frequency and hematuria.   Musculoskeletal:  Negative for arthralgias, back pain and myalgias.  Skin:  Negative for itching, rash and wound.  Neurological:  Negative for dizziness, extremity weakness, headaches, light-headedness and numbness.  Hematological:  Negative for adenopathy.  Psychiatric/Behavioral:  Negative for depression and sleep disturbance. The patient is not nervous/anxious.      VITALS:  Blood pressure 118/89, pulse 81, temperature 98.2 F (36.8 C), temperature source Oral, resp. rate 20, height 6\' 2"  (1.88 m), weight 282 lb 3.2 oz (128 kg), SpO2 100 %.  Wt Readings from Last 3 Encounters:  07/19/22 282 lb 3.2 oz (128 kg)  03/04/21 297 lb 4.8 oz (134.9 kg)  12/03/19 (!) 339 lb 8 oz (154 kg)    Body mass index is 36.23 kg/m.  Performance status (ECOG): 0 - Asymptomatic  PHYSICAL EXAM:  Physical Exam Vitals and nursing note reviewed.  Constitutional:      General: He is not in acute distress.    Appearance: Normal appearance. He is morbidly obese. He is not ill-appearing.  HENT:     Head: Normocephalic and atraumatic.     Mouth/Throat:     Mouth: Mucous membranes are moist.     Pharynx: Oropharynx is clear. No oropharyngeal exudate or posterior oropharyngeal erythema.  Eyes:     General: No scleral icterus.    Extraocular Movements: Extraocular movements intact.     Conjunctiva/sclera: Conjunctivae normal.     Pupils: Pupils are equal, round, and reactive to light.  Cardiovascular:     Rate and Rhythm: Normal rate and regular rhythm.     Heart sounds: Normal heart sounds. No  murmur heard.    No friction rub. No gallop.  Pulmonary:     Effort: Pulmonary effort is normal.     Breath sounds: Normal breath sounds. No wheezing, rhonchi or rales.  Abdominal:     General: Bowel sounds are normal. There is no distension.     Palpations: Abdomen is soft. There is no hepatomegaly, splenomegaly or mass.     Tenderness: There is no abdominal tenderness.  Musculoskeletal:        General: Normal range of motion.     Cervical back: Normal range of motion and neck supple. No tenderness.     Right lower leg: Edema (1+) present.     Left lower leg: Edema (1+) present.  Lymphadenopathy:     Cervical: No cervical adenopathy.     Upper Body:  Right upper body: No supraclavicular or axillary adenopathy.     Left upper body: No supraclavicular or axillary adenopathy.     Lower Body: No right inguinal adenopathy. No left inguinal adenopathy.  Skin:    General: Skin is warm and dry.     Coloration: Skin is not jaundiced.     Findings: No rash.  Neurological:     Mental Status: He is alert and oriented to person, place, and time.     Cranial Nerves: No cranial nerve deficit.  Psychiatric:        Mood and Affect: Mood normal.        Behavior: Behavior normal.        Thought Content: Thought content normal.     LABS:      Latest Ref Rng & Units 07/19/2022    9:04 AM 02/18/2021   12:00 AM 09/02/2020   12:00 AM  CBC  WBC 4.0 - 10.5 K/uL 6.1  7.3     7.1   Hemoglobin 13.0 - 17.0 g/dL 21.3  08.6     57.8   Hematocrit 39.0 - 52.0 % 37.6  47     45   Platelets 150 - 400 K/uL 197  225     236      This result is from an external source.      Latest Ref Rng & Units 07/19/2022    9:04 AM 02/18/2021   12:00 AM 09/02/2020   12:00 AM  CMP  Glucose 70 - 99 mg/dL 469     BUN 8 - 23 mg/dL 22  25     45   Creatinine 0.61 - 1.24 mg/dL 6.29  1.7     2.1   Sodium 135 - 145 mmol/L 138  137     138   Potassium 3.5 - 5.1 mmol/L 3.9  4.6     4.4   Chloride 98 - 111 mmol/L 105  107      107   CO2 22 - 32 mmol/L 23  23     20    Calcium 8.9 - 10.3 mg/dL 9.2  9.1     9.5   Total Protein 6.5 - 8.1 g/dL 7.3     Total Bilirubin 0.3 - 1.2 mg/dL 1.6     Alkaline Phos 38 - 126 U/L 79  73     79   AST 15 - 41 U/L 25  42     43   ALT 0 - 44 U/L 19  27     32      This result is from an external source.     No results found for: "CEA1", "CEA" / No results found for: "CEA1", "CEA" No results found for: "PSA1" No results found for: "BMW413" No results found for: "CAN125"   Lab Results  Component Value Date   TOTALPROTELP 6.9 02/18/2021   ALBUMINELP 3.6 02/18/2021   A1GS 0.2 02/18/2021   A2GS 0.9 02/18/2021   BETS 1.1 02/18/2021   GAMS 1.1 02/18/2021   MSPIKE 0.3 (H) 02/18/2021   SPEI Comment 02/18/2021   No results found for: "TIBC", "FERRITIN", "IRONPCTSAT"  Lab Results  Component Value Date   LDH 183 07/24/2019    STUDIES:  No results found.    HISTORY:   Past Medical History:  Diagnosis Date   Allergy    sinus problems   Anemia    Arthritis    Blood transfusion without reported diagnosis 2005  Cancer [facility]) 2005   Small bowel Cancer   Clotting disorder (HCC) 2016   Depression    Diabetes mellitus without complication (HCC)    controlled by diet.   Diverticula, colon    Fatigue    Gout    history of gout   Hyperlipidemia    Hypertension    Memory loss    Muscle pain    and cramping   Osteoporosis    pt denies hx of this.   Sleep apnea    is not on a Cpap    Past Surgical History:  Procedure Laterality Date   bowel removal     CARPAL TUNNEL WITH CUBITAL TUNNEL     COLONOSCOPY  2016   left arm surgery     LEG SURGERY     NOSE SURGERY     SMALL INTESTINE SURGERY     cancer removed and resectioned    Family History  Problem Relation Age of Onset   Stroke Mother    Arthritis Mother    Diabetes Mother    Hypertension Mother    Cancer Father    Stroke Father    Hypertension Father    Gout Father    Colon cancer Father 67    Esophageal cancer Neg Hx    Rectal cancer Neg Hx    Stomach cancer Neg Hx     Social History:  reports that he has quit smoking. He has never used smokeless tobacco. He reports that he does not drink alcohol and does not use drugs.The patient is alone today.  Allergies: No Known Allergies  Current Medications: Current Outpatient Medications  Medication Sig Dispense Refill   allopurinol (ZYLOPRIM) 300 MG tablet Take 1 tablet by mouth daily.     atorvastatin (LIPITOR) 80 MG tablet Take by mouth.     Cholecalciferol 125 MCG (5000 UT) TABS Take by mouth.     ELIQUIS 5 MG TABS tablet Take 5 mg by mouth 2 (two) times daily. Patient voiced he is taking one tablet daily     losartan (COZAAR) 50 MG tablet Take by mouth.     torsemide (DEMADEX) 20 MG tablet Take 1 tablet by mouth daily.     amLODipine (NORVASC) 5 MG tablet Take 5 mg by mouth every morning.     Febuxostat 80 MG TABS Take 1 tablet by mouth daily.     hydrocortisone (ANUSOL-HC) 2.5 % rectal cream Place 1 application rectally 2 (two) times daily. Hydrocortisone 2.5% cream rectal area twice daily after bowel movements for 10 days then as needed 30 g 0   Omega-3 1000 MG CAPS Take by mouth.     No current facility-administered medications for this visit.     ASSESSMENT & PLAN:   Assessment: Anthony Le is a 67 y.o. male with  Remote history of stage III small bowel cancer.  He remains without evidence of recurrence.   Monoclonal gammopathy of uncertain significance, IgA kappa was 2016.  This has not progressed over 8 years.   Recurrent pulmonary emboli and left lower extremity deep venous thrombosis November 2023.  He remains on Eliquis.  The patient has fixed risk factors for recurrent thrombosis of obesity, congestive heart failure, sleep apnea and chronic kidney disease. Chronic microcytosis without anemia may be due to CKD. New hyperbilirubinemia. He is status post cholecystectomy.  Plan: CEA is pending from today.   Serum protein electrophoresis, serum light chains and quantitative immunoglobulins are pending from today.   Will need  to discuss lifelong anticoagulation based on fixed risk factors. Consider hemoglobin electrophoresis to evaluate  Evaluate for hemolysis as cause of hyperbilirubinemia.  I will plan to have him see Dr. Angelene Giovanni in 2 to 3 weeks for further management.  The patient understands the plans discussed today and is in agreement with them.  He knows to contact our office if he develops concerns prior to his next appointment.     I provided 30 minutes of face-to-face time during this encounter and > 50% was spent counseling as documented under my assessment and plan.    Adah Perl, PA-C  [facility] AT [facility] [address] [place] [address] Dept: [phone] Dept Fax: [phone]   No orders of the defined types were placed in this encounter.

## 2022-07-19 ENCOUNTER — Encounter: Payer: Self-pay | Admitting: Hematology and Oncology

## 2022-07-19 ENCOUNTER — Inpatient Hospital Stay (INDEPENDENT_AMBULATORY_CARE_PROVIDER_SITE_OTHER): Payer: PPO | Admitting: Hematology and Oncology

## 2022-07-19 ENCOUNTER — Inpatient Hospital Stay: Payer: PPO | Attending: Hematology and Oncology

## 2022-07-19 ENCOUNTER — Other Ambulatory Visit: Payer: Self-pay

## 2022-07-19 VITALS — BP 118/89 | HR 81 | Temp 98.2°F | Resp 20 | Ht 74.0 in | Wt 282.2 lb

## 2022-07-19 DIAGNOSIS — E1122 Type 2 diabetes mellitus with diabetic chronic kidney disease: Secondary | ICD-10-CM | POA: Diagnosis not present

## 2022-07-19 DIAGNOSIS — Z7901 Long term (current) use of anticoagulants: Secondary | ICD-10-CM | POA: Diagnosis not present

## 2022-07-19 DIAGNOSIS — I129 Hypertensive chronic kidney disease with stage 1 through stage 4 chronic kidney disease, or unspecified chronic kidney disease: Secondary | ICD-10-CM | POA: Diagnosis not present

## 2022-07-19 DIAGNOSIS — N183 Chronic kidney disease, stage 3 unspecified: Secondary | ICD-10-CM | POA: Diagnosis not present

## 2022-07-19 DIAGNOSIS — Z8719 Personal history of other diseases of the digestive system: Secondary | ICD-10-CM | POA: Insufficient documentation

## 2022-07-19 DIAGNOSIS — Z85038 Personal history of other malignant neoplasm of large intestine: Secondary | ICD-10-CM | POA: Diagnosis not present

## 2022-07-19 DIAGNOSIS — Z800 Family history of malignant neoplasm of digestive organs: Secondary | ICD-10-CM | POA: Insufficient documentation

## 2022-07-19 DIAGNOSIS — D689 Coagulation defect, unspecified: Secondary | ICD-10-CM | POA: Insufficient documentation

## 2022-07-19 DIAGNOSIS — D472 Monoclonal gammopathy: Secondary | ICD-10-CM

## 2022-07-19 DIAGNOSIS — E785 Hyperlipidemia, unspecified: Secondary | ICD-10-CM | POA: Diagnosis not present

## 2022-07-19 DIAGNOSIS — Z87891 Personal history of nicotine dependence: Secondary | ICD-10-CM | POA: Insufficient documentation

## 2022-07-19 DIAGNOSIS — Z86711 Personal history of pulmonary embolism: Secondary | ICD-10-CM | POA: Diagnosis not present

## 2022-07-19 DIAGNOSIS — R718 Other abnormality of red blood cells: Secondary | ICD-10-CM | POA: Insufficient documentation

## 2022-07-19 DIAGNOSIS — R600 Localized edema: Secondary | ICD-10-CM | POA: Insufficient documentation

## 2022-07-19 DIAGNOSIS — Z86718 Personal history of other venous thrombosis and embolism: Secondary | ICD-10-CM | POA: Insufficient documentation

## 2022-07-19 DIAGNOSIS — Z8616 Personal history of COVID-19: Secondary | ICD-10-CM | POA: Insufficient documentation

## 2022-07-19 DIAGNOSIS — Z79899 Other long term (current) drug therapy: Secondary | ICD-10-CM | POA: Insufficient documentation

## 2022-07-19 DIAGNOSIS — R7989 Other specified abnormal findings of blood chemistry: Secondary | ICD-10-CM | POA: Diagnosis not present

## 2022-07-19 DIAGNOSIS — R197 Diarrhea, unspecified: Secondary | ICD-10-CM | POA: Insufficient documentation

## 2022-07-19 DIAGNOSIS — C179 Malignant neoplasm of small intestine, unspecified: Secondary | ICD-10-CM

## 2022-07-19 LAB — CBC WITH DIFFERENTIAL (CANCER CENTER ONLY)
Abs Immature Granulocytes: 0.01 10*3/uL (ref 0.00–0.07)
Basophils Absolute: 0 10*3/uL (ref 0.0–0.1)
Basophils Relative: 1 %
Eosinophils Absolute: 0.1 10*3/uL (ref 0.0–0.5)
Eosinophils Relative: 2 %
HCT: 37.6 % — ABNORMAL LOW (ref 39.0–52.0)
Hemoglobin: 13.3 g/dL (ref 13.0–17.0)
Immature Granulocytes: 0 %
Lymphocytes Relative: 27 %
Lymphs Abs: 1.6 10*3/uL (ref 0.7–4.0)
MCH: 28.1 pg (ref 26.0–34.0)
MCHC: 35.4 g/dL (ref 30.0–36.0)
MCV: 79.3 fL — ABNORMAL LOW (ref 80.0–100.0)
Monocytes Absolute: 0.6 10*3/uL (ref 0.1–1.0)
Monocytes Relative: 10 %
Neutro Abs: 3.7 10*3/uL (ref 1.7–7.7)
Neutrophils Relative %: 60 %
Platelet Count: 197 10*3/uL (ref 150–400)
RBC: 4.74 MIL/uL (ref 4.22–5.81)
RDW: 15.8 % — ABNORMAL HIGH (ref 11.5–15.5)
WBC Count: 6.1 10*3/uL (ref 4.0–10.5)
nRBC: 0 % (ref 0.0–0.2)

## 2022-07-19 LAB — CMP (CANCER CENTER ONLY)
ALT: 19 U/L (ref 0–44)
AST: 25 U/L (ref 15–41)
Albumin: 4 g/dL (ref 3.5–5.0)
Alkaline Phosphatase: 79 U/L (ref 38–126)
Anion gap: 10 (ref 5–15)
BUN: 22 mg/dL (ref 8–23)
CO2: 23 mmol/L (ref 22–32)
Calcium: 9.2 mg/dL (ref 8.9–10.3)
Chloride: 105 mmol/L (ref 98–111)
Creatinine: 1.9 mg/dL — ABNORMAL HIGH (ref 0.61–1.24)
GFR, Estimated: 38 mL/min — ABNORMAL LOW (ref >60)
Glucose, Bld: 113 mg/dL — ABNORMAL HIGH (ref 70–99)
Potassium: 3.9 mmol/L (ref 3.5–5.1)
Sodium: 138 mmol/L (ref 135–145)
Total Bilirubin: 1.6 mg/dL — ABNORMAL HIGH (ref 0.3–1.2)
Total Protein: 7.3 g/dL (ref 6.5–8.1)

## 2022-07-20 ENCOUNTER — Telehealth: Payer: Self-pay | Admitting: Oncology

## 2022-07-20 LAB — KAPPA/LAMBDA LIGHT CHAINS
Kappa free light chain: 92.3 mg/L — ABNORMAL HIGH (ref 3.3–19.4)
Kappa, lambda light chain ratio: 2.65 — ABNORMAL HIGH (ref 0.26–1.65)
Lambda free light chains: 34.8 mg/L — ABNORMAL HIGH (ref 5.7–26.3)

## 2022-07-20 LAB — CEA: CEA: 3.2 ng/mL (ref 0.0–4.7)

## 2022-07-20 LAB — HAPTOGLOBIN: Haptoglobin: 158 mg/dL (ref 32–363)

## 2022-07-20 NOTE — Telephone Encounter (Signed)
Patient has been scheduled. Aware of appt date and time   Scheduling Message Entered by Belva Crome A on 07/19/2022 at  9:37 AM Priority: Routine <No visit type provided>  Department: [facility]  Provider:  Scheduling Notes:  Labs and f/u with [facility] in 6 months

## 2022-07-21 ENCOUNTER — Encounter: Payer: Self-pay | Admitting: Hematology and Oncology

## 2022-07-21 ENCOUNTER — Telehealth: Payer: Self-pay

## 2022-07-21 NOTE — Telephone Encounter (Addendum)
7/12 - Pt states he was here a few days ago. He would like to know if lab results are back? 07/25/2022 - Kelli,PA, had Krista,LPN, to notify pt that his Abnormal protein was normal.

## 2022-07-21 NOTE — Telephone Encounter (Signed)
Advised patient of test results and recommendations as per earlier message.  He denies abdominal pain or nausea. Intermittent diarrhea. He is only taking Eliquis daily, he will increase to twice daily. SPEP/IFE and QIGs still pending. Will schedule him to see Dr. Elvera Lennox in 3 weeks.  He expresses understanding.

## 2022-07-24 LAB — MULTIPLE MYELOMA PANEL, SERUM
Albumin SerPl Elph-Mcnc: 3.5 g/dL (ref 2.9–4.4)
Albumin/Glob SerPl: 1.3 (ref 0.7–1.7)
Alpha 1: 0.2 g/dL (ref 0.0–0.4)
Alpha2 Glob SerPl Elph-Mcnc: 0.7 g/dL (ref 0.4–1.0)
B-Globulin SerPl Elph-Mcnc: 0.9 g/dL (ref 0.7–1.3)
Gamma Glob SerPl Elph-Mcnc: 1 g/dL (ref 0.4–1.8)
Globulin, Total: 2.9 g/dL (ref 2.2–3.9)
IgA: 477 mg/dL — ABNORMAL HIGH (ref 61–437)
IgG (Immunoglobin G), Serum: 909 mg/dL (ref 603–1613)
IgM (Immunoglobulin M), Srm: 41 mg/dL (ref 20–172)
M Protein SerPl Elph-Mcnc: 0.2 g/dL — ABNORMAL HIGH
Total Protein ELP: 6.4 g/dL (ref 6.0–8.5)

## 2022-07-24 NOTE — Telephone Encounter (Signed)
 Contacted pt to schedule an appt. Unable to reach via phone, voicemail was left.

## 2022-07-24 NOTE — Telephone Encounter (Signed)
 Patient has been scheduled. Aware of appt date and time  

## 2022-07-25 ENCOUNTER — Telehealth: Payer: Self-pay

## 2022-07-25 NOTE — Telephone Encounter (Signed)
Detailed message left for patient , patient to call with any concerns.

## 2022-07-25 NOTE — Telephone Encounter (Signed)
-----   Message from Adah Perl sent at 07/25/2022  7:50 AM EDT ----- Please let him know his abnormal protein is stable. Thanks

## 2022-07-28 DIAGNOSIS — R269 Unspecified abnormalities of gait and mobility: Secondary | ICD-10-CM | POA: Diagnosis not present

## 2022-08-07 ENCOUNTER — Telehealth: Payer: Self-pay | Admitting: Oncology

## 2022-08-07 ENCOUNTER — Inpatient Hospital Stay: Payer: PPO

## 2022-08-07 ENCOUNTER — Inpatient Hospital Stay (INDEPENDENT_AMBULATORY_CARE_PROVIDER_SITE_OTHER): Payer: PPO | Admitting: Oncology

## 2022-08-07 VITALS — BP 150/88 | HR 77 | Temp 97.9°F | Resp 16 | Ht 74.0 in | Wt 298.0 lb

## 2022-08-07 DIAGNOSIS — D649 Anemia, unspecified: Secondary | ICD-10-CM

## 2022-08-07 DIAGNOSIS — D6859 Other primary thrombophilia: Secondary | ICD-10-CM | POA: Diagnosis not present

## 2022-08-07 DIAGNOSIS — D472 Monoclonal gammopathy: Secondary | ICD-10-CM

## 2022-08-07 DIAGNOSIS — I2692 Saddle embolus of pulmonary artery without acute cor pulmonale: Secondary | ICD-10-CM | POA: Diagnosis not present

## 2022-08-07 DIAGNOSIS — Z85038 Personal history of other malignant neoplasm of large intestine: Secondary | ICD-10-CM | POA: Diagnosis not present

## 2022-08-07 DIAGNOSIS — Z7901 Long term (current) use of anticoagulants: Secondary | ICD-10-CM | POA: Diagnosis not present

## 2022-08-07 LAB — CBC WITH DIFFERENTIAL (CANCER CENTER ONLY)
Abs Immature Granulocytes: 0.01 10*3/uL (ref 0.00–0.07)
Basophils Absolute: 0.1 10*3/uL (ref 0.0–0.1)
Basophils Relative: 1 %
Eosinophils Absolute: 0.2 10*3/uL (ref 0.0–0.5)
Eosinophils Relative: 3 %
HCT: 37.4 % — ABNORMAL LOW (ref 39.0–52.0)
Hemoglobin: 13.1 g/dL (ref 13.0–17.0)
Immature Granulocytes: 0 %
Lymphocytes Relative: 25 %
Lymphs Abs: 1.4 10*3/uL (ref 0.7–4.0)
MCH: 28.2 pg (ref 26.0–34.0)
MCHC: 35 g/dL (ref 30.0–36.0)
MCV: 80.6 fL (ref 80.0–100.0)
Monocytes Absolute: 0.4 10*3/uL (ref 0.1–1.0)
Monocytes Relative: 6 %
Neutro Abs: 3.8 10*3/uL (ref 1.7–7.7)
Neutrophils Relative %: 65 %
Platelet Count: 192 10*3/uL (ref 150–400)
RBC: 4.64 MIL/uL (ref 4.22–5.81)
RDW: 16 % — ABNORMAL HIGH (ref 11.5–15.5)
WBC Count: 5.9 10*3/uL (ref 4.0–10.5)
nRBC: 0 % (ref 0.0–0.2)

## 2022-08-07 LAB — FERRITIN: Ferritin: 189 ng/mL (ref 24–336)

## 2022-08-07 LAB — APTT: aPTT: 32 seconds (ref 24–36)

## 2022-08-07 LAB — PROTIME-INR
INR: 1.2 (ref 0.8–1.2)
Prothrombin Time: 15.8 seconds — ABNORMAL HIGH (ref 11.4–15.2)

## 2022-08-07 LAB — D-DIMER, QUANTITATIVE: D-Dimer, Quant: 2.91 ug/mL-FEU — ABNORMAL HIGH (ref 0.00–0.50)

## 2022-08-07 LAB — FOLATE: Folate: 20.1 ng/mL (ref >5.9)

## 2022-08-07 LAB — DIRECT ANTIGLOBULIN TEST (NOT AT ARMC)
DAT, IgG: NEGATIVE
DAT, complement: NEGATIVE

## 2022-08-07 LAB — RETICULOCYTES
Immature Retic Fract: 29.3 % — ABNORMAL HIGH (ref 2.3–15.9)
RBC.: 4.64 MIL/uL (ref 4.22–5.81)
Retic Count, Absolute: 98.4 10*3/uL (ref 19.0–186.0)
Retic Ct Pct: 2.1 % (ref 0.4–3.1)

## 2022-08-07 LAB — VITAMIN B12: Vitamin B-12: 244 pg/mL (ref 180–914)

## 2022-08-07 NOTE — Telephone Encounter (Signed)
08/07/22 Spoke with patient and confirmed next appt

## 2022-08-07 NOTE — Progress Notes (Signed)
[facility] CancerFollow up  Visit:  Patient Care Team: Paulina Fusi, MD as PCP - General (Internal Medicine) Elvis Coil, MD as Consulting Physician (Nephrology)  CHIEF COMPLAINTS/PURPOSE OF CONSULTATION:  Oncology History  Small bowel cancer [facility])  09/13/2002 Cancer Staging   Staging form: Small Intestine, AJCC 6th Edition - Clinical stage from 09/13/2002: Stage II (T3, N0, M0) - Signed by Dellia Beckwith, MD on 03/04/2021 Staged by: Managing physician Diagnostic confirmation: Positive histology Specimen type: Excision Histopathologic type: Adenocarcinoma, intestinal type Total positive nodes: 0 Stage prefix: Initial diagnosis Lymphatic vessel invasion (L): L0 - No lymphatic vessel invasion Venous invasion (V): V0 - No venous invasion Prognostic indicators: Treated with surgery and adjuvant chemotherapy, incl doxorubicin   10/03/2002 Initial Diagnosis   Small bowel cancer (HCC)     HISTORY OF PRESENTING ILLNESS: Anthony Le 67 y.o. 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      July 24 2019: SPEP with IEP demonstrated 0.2 g/dL of IgA kappa monoclonal protein.  Serum free kappa 108.2 lambda 36.1 with a kappa lambda 3.0 Ig G 1061 IgA 586 IgM 45 Beta 2 microglobulin 2.5 LDH 183 CMP notable for creatinine 1.8 WBC 7.8 globin 15.0 MCV 77 platelet count 213; 63 segs 26 lymphs monos 1 EO 1 basophil  July 31, 2019: 24-hour urine showed loss of 5298 mg of protein per day.  UPEP with IEP negative  August 07, 2019: Left posterior iliac crest bone marrow biopsy-essentially normocellular marrow for age (40 to 50%) orderly maturation.   Plasma cells constitute 5% of the marrow cellularity with a small percentage have kappa predominance  November 13, 2021: Admitted to [facility] with massive recurrent PE with large clot burden resulting in right heart strain.  Patient was placed in ICU treated with heparin and discharged on Eliquis  CT PA- Large volume acute bilateral pulmonary emboli, including the distal right main pulmonary artery.  Clot burden is large.  Right heart strain (RV/LV Ratio = 1.57) consistent with at least submassive (intermediate risk) PE.   Cardiac ECHO- LV size normal.  Mild LVH.  LV ejection fraction = 50-55%.  No segmental wall motion abnormalities in LV.  RV normal in size and function. LA mildly dilated.  Mild pulmonic valvular regurgitation. .  Aortic Sinus(es) of Valsalva are mildly dilated. IVC size normal.    March 21, 2022: Diagnosed with nephropathy due to chronic NSAID use, hypertension  July 19, 2022: WBC 6.1 hemoglobin 13.3 MCV 79 platelet count 197; 60 segs 27 lymphs 10 monos 2 eos 1 basophil SPEP IEP demonstrated 0.2 g/dL of IgA kappa monoclonal protein.  Serum free kappa 92.3 lambda 34.8 with a kappa lambda 2.65 IgG 909 IgA 477 IgM 41 CEA 3.2 Chemistries notable for creatinine 1.9  August 07 2022:  Scheduled follow up for MGUS, anemia and VTE. Patient attributes first VTE to use of testosterone and the second VTE to the use of a gel he was using for severe pain in his knees.  He is currently on Xarelto 20 mg daily.    Currently has severe dental problems and needs extractions.    Social:  Hauls junk and mows yards.  Quit smoking 30 yrs ago.  EtOH has not drank anything in years but only rarely before that  [facility] Mother died 16 CVA Father died 43 colon cancer Brothers x 5- one brother had cancer in jaw, eldest brother had blood clots Sisters x 2 - no history of cancer Maternal Grandmother died of endometrial cancer  Review of Systems  Constitutional:  Negative for appetite change,  chills, fatigue and fever.       Lost weight after change in antihypertensive by nephrology  HENT:   Negative for mouth sores, nosebleeds, sore throat, trouble swallowing and voice change.   Eyes:  Negative for eye problems and icterus.       Vision changes:  None  Respiratory:  Negative for chest tightness, cough, hemoptysis and wheezing.        DOE on trying to do things in a hurry  Cardiovascular:  Negative for chest pain and palpitations.       Has LE edema for which he takes a diuretic PRN  Gastrointestinal:  Negative for abdominal pain, blood in stool, constipation, diarrhea, nausea and vomiting.  Endocrine: Negative for hot flashes.       Cold intolerance:  none Heat intolerance:  none  Genitourinary:  Positive for frequency. Negative for bladder incontinence, difficulty urinating, dysuria, hematuria and nocturia.   Musculoskeletal:  Negative for arthralgias, back pain, gait problem, myalgias, neck pain and neck stiffness.  Skin:  Negative for rash and wound.       Sometimes had pruritus behind scrotum  Neurological:  Negative for dizziness, extremity weakness, gait problem, headaches, light-headedness, numbness and speech difficulty.  Hematological:  Negative for adenopathy. Does not bruise/bleed easily.  Psychiatric/Behavioral:  Negative for sleep disturbance and suicidal ideas. The patient is not nervous/anxious.     MEDICAL HISTORY: Past Medical History:  Diagnosis Date   Allergy    sinus problems   Anemia    Arthritis    Blood transfusion without reported diagnosis 2005   Cancer [facility]) 2005   Small bowel Cancer   Clotting disorder (HCC) 2016   Depression    Diabetes mellitus without complication (HCC)    controlled by diet.   Diverticula, colon    Fatigue    Gout    history of gout   Hyperlipidemia    Hypertension    Memory loss    Muscle pain    and cramping   Osteoporosis    pt denies hx of this.   Sleep apnea    is not on a Cpap    SURGICAL  HISTORY: Past Surgical History:  Procedure Laterality Date   bowel removal     CARPAL TUNNEL WITH CUBITAL TUNNEL     COLONOSCOPY  2016   left arm surgery     LEG SURGERY     NOSE SURGERY     SMALL INTESTINE SURGERY     cancer removed and resectioned    SOCIAL HISTORY: Social History   Socioeconomic History   Marital status: Legally Separated    Spouse name: Not on file   Number of children: Not on file   Years of education: Not on file   Highest education level: Not on file  Occupational History   Not on file  Tobacco Use   Smoking status: Former   Smokeless tobacco: Never  Vaping Use   Vaping status: Never Used  Substance and Sexual Activity   Alcohol use: No   Drug use: No   Sexual activity: Not on file  Other Topics Concern  Not on file  Social History Narrative   Not on file   Social Determinants of Health   Financial Resource Strain: Not on file  Food Insecurity: Not on file  Transportation Needs: Not on file  Physical Activity: Not on file  Stress: Not on file  Social Connections: Not on file  Intimate Partner Violence: Not on file    FAMILY HISTORY Family History  Problem Relation Age of Onset   Stroke Mother    Arthritis Mother    Diabetes Mother    Hypertension Mother    Cancer Father    Stroke Father    Hypertension Father    Gout Father    Colon cancer Father 67   Esophageal cancer Neg Hx    Rectal cancer Neg Hx    Stomach cancer Neg Hx     ALLERGIES:  has No Known Allergies.  MEDICATIONS:  Current Outpatient Medications  Medication Sig Dispense Refill   rivaroxaban (XARELTO) 20 MG TABS tablet Take 20 mg by mouth daily with supper.     allopurinol (ZYLOPRIM) 300 MG tablet Take 1 tablet by mouth daily.     amLODipine (NORVASC) 5 MG tablet Take 5 mg by mouth every morning.     atorvastatin (LIPITOR) 80 MG tablet Take by mouth.     Cholecalciferol 125 MCG (5000 UT) TABS Take by mouth.     Febuxostat 80 MG TABS Take 1 tablet by mouth  daily.     hydrocortisone (ANUSOL-HC) 2.5 % rectal cream Place 1 application rectally 2 (two) times daily. Hydrocortisone 2.5% cream rectal area twice daily after bowel movements for 10 days then as needed 30 g 0   losartan (COZAAR) 50 MG tablet Take by mouth.     Omega-3 1000 MG CAPS Take by mouth.     torsemide (DEMADEX) 20 MG tablet Take 1 tablet by mouth daily. Taking 1/2 tab twice daily     No current facility-administered medications for this visit.    PHYSICAL EXAMINATION:  ECOG PERFORMANCE STATUS: 1 - Symptomatic but completely ambulatory   Vitals:   08/07/22 0858 08/07/22 0929  BP: (!) 154/103 (!) 150/88  Pulse: 77   Resp: 16   Temp: 97.9 F (36.6 C)   SpO2: 100%     Filed Weights   08/07/22 0858  Weight: 298 lb (135.2 kg)     Physical Exam Vitals and nursing note reviewed.  Constitutional:      General: He is not in acute distress.    Appearance: Normal appearance. He is obese. He is not diaphoretic.     Comments: Here alone  HENT:     Head: Normocephalic and atraumatic.     Right Ear: External ear normal.     Left Ear: External ear normal.     Nose: Nose normal.  Eyes:     General: No scleral icterus.    Conjunctiva/sclera: Conjunctivae normal.     Pupils: Pupils are equal, round, and reactive to light.  Cardiovascular:     Rate and Rhythm: Normal rate and regular rhythm.     Heart sounds:     No friction rub. No gallop.  Pulmonary:     Effort: Pulmonary effort is normal.     Breath sounds: Normal breath sounds. No wheezing, rhonchi or rales.  Abdominal:     Palpations: Abdomen is soft.     Tenderness: There is no abdominal tenderness. There is no guarding or rebound.  Musculoskeletal:     Cervical back: Normal  range of motion and neck supple. No rigidity or tenderness.     Right lower leg: Edema present.     Left lower leg: Edema present.     Comments: Decreased ROM in knees due to arthritis  Lymphadenopathy:     Head:     Right side of head:  No submental, submandibular, tonsillar, preauricular, posterior auricular or occipital adenopathy.     Left side of head: No submental, submandibular, tonsillar, preauricular, posterior auricular or occipital adenopathy.     Cervical: No cervical adenopathy.     Right cervical: No superficial, deep or posterior cervical adenopathy.    Left cervical: No superficial, deep or posterior cervical adenopathy.     Upper Body:     Right upper body: No supraclavicular or axillary adenopathy.     Left upper body: No supraclavicular or axillary adenopathy.     Lower Body: No right inguinal adenopathy. No left inguinal adenopathy.  Skin:    General: Skin is warm and dry.     Coloration: Skin is not jaundiced or pale.     Findings: No bruising.  Neurological:     General: No focal deficit present.     Mental Status: He is alert and oriented to person, place, and time.     Gait: Gait abnormal.     Comments: Gait abnormal owing to arthritis in knees  Psychiatric:        Mood and Affect: Mood normal.        Behavior: Behavior normal.        Thought Content: Thought content normal.        Judgment: Judgment normal.      LABORATORY DATA: I have personally reviewed the data as listed:  Orders Only on 07/19/2022  Component Date Value Ref Range Status   Haptoglobin 07/19/2022 158  32 - 363 mg/dL Final   Comment: (NOTE) Performed At: [facility] [address] [place], [place] [id number] Jolene Schimke MD QI:[id number]   Appointment on 07/19/2022  Component Date Value Ref Range Status   CEA 07/19/2022 3.2  0.0 - 4.7 ng/mL Final   Comment: (NOTE)                             Nonsmokers          <3.9                             Smokers             <5.6 [employer] Electrochemiluminescence Immunoassay (ECLIA) Values obtained with different assay methods or kits cannot be used interchangeably.  Results cannot be interpreted as absolute evidence of the presence or absence of  malignant disease. Performed At: [facility] [address] [place], [place] [id number] Jolene Schimke MD OZ:[id number]    Kappa free light chain 07/19/2022 92.3 (H)  3.3 - 19.4 mg/L Final   Lambda free light chains 07/19/2022 34.8 (H)  5.7 - 26.3 mg/L Final   Kappa, lambda light chain ratio 07/19/2022 2.65 (H)  0.26 - 1.65 Final   Comment: (NOTE) Performed At: [facility] [address] [place], [place] [id number] Jolene Schimke MD [id number]    IgG (Immunoglobin G), Serum 07/19/2022 909  603 - 1,613 mg/dL Final   IgA 66/06/3014 477 (H)  61 - 437 mg/dL Final   IgM (Immunoglobulin M), Srm 07/19/2022 41  20 - 172  mg/dL Final   Total Protein ELP 07/19/2022 6.4  6.0 - 8.5 g/dL Corrected   Albumin SerPl Elph-Mcnc 07/19/2022 3.5  2.9 - 4.4 g/dL Corrected   Alpha 1 16/10/9602 0.2  0.0 - 0.4 g/dL Corrected   Alpha2 Glob SerPl Elph-Mcnc 07/19/2022 0.7  0.4 - 1.0 g/dL Corrected   B-Globulin SerPl Elph-Mcnc 07/19/2022 0.9  0.7 - 1.3 g/dL Corrected   Gamma Glob SerPl Elph-Mcnc 07/19/2022 1.0  0.4 - 1.8 g/dL Corrected   M Protein SerPl Elph-Mcnc 07/19/2022 0.2 (H)  Not Observed g/dL Corrected   Globulin, Total 07/19/2022 2.9  2.2 - 3.9 g/dL Corrected   Albumin/Glob SerPl 07/19/2022 1.3  0.7 - 1.7 Corrected   IFE 1 07/19/2022 Comment (A)   Corrected   Comment: (NOTE) Immunofixation shows IgA monoclonal protein with kappa light chain specificity.    Please Note 07/19/2022 Comment   Corrected   Comment: (NOTE) Protein electrophoresis scan will follow via computer, mail, or courier delivery. Performed At: [facility] [address] [place], [place] [id number] Jolene Schimke MD YN:[id number]    Sodium 07/19/2022 138  135 - 145 mmol/L Final   Potassium 07/19/2022 3.9  3.5 - 5.1 mmol/L Final   Chloride 07/19/2022 105  98 - 111 mmol/L Final   CO2 07/19/2022 23  22 - 32 mmol/L Final   Glucose, Bld 07/19/2022 113 (H)  70 - 99 mg/dL Final   Glucose reference range  applies only to samples taken after fasting for at least 8 hours.   BUN 07/19/2022 22  8 - 23 mg/dL Final   Creatinine 65/78/4696 1.90 (H)  0.61 - 1.24 mg/dL Final   Calcium 29/52/8413 9.2  8.9 - 10.3 mg/dL Final   Total Protein 24/40/1027 7.3  6.5 - 8.1 g/dL Final   Albumin 25/36/6440 4.0  3.5 - 5.0 g/dL Final   AST 34/74/2595 25  15 - 41 U/L Final   ALT 07/19/2022 19  0 - 44 U/L Final   Alkaline Phosphatase 07/19/2022 79  38 - 126 U/L Final   Total Bilirubin 07/19/2022 1.6 (H)  0.3 - 1.2 mg/dL Final   GFR, Estimated 07/19/2022 38 (L)  >60 mL/min Final   Comment: (NOTE) Calculated using the CKD-EPI Creatinine Equation (2021)    Anion gap 07/19/2022 10  5 - 15 Final   Performed at [facility], [address] [place], [place] [address]   WBC Count 07/19/2022 6.1  4.0 - 10.5 K/uL Final   RBC 07/19/2022 4.74  4.22 - 5.81 MIL/uL Final   Hemoglobin 07/19/2022 13.3  13.0 - 17.0 g/dL Final   HCT 64/33/2951 37.6 (L)  39.0 - 52.0 % Final   MCV 07/19/2022 79.3 (L)  80.0 - 100.0 fL Final   MCH 07/19/2022 28.1  26.0 - 34.0 pg Final   MCHC 07/19/2022 35.4  30.0 - 36.0 g/dL Final   RDW 88/41/6606 15.8 (H)  11.5 - 15.5 % Final   Platelet Count 07/19/2022 197  150 - 400 K/uL Final   nRBC 07/19/2022 0.0  0.0 - 0.2 % Final   Neutrophils Relative % 07/19/2022 60  % Final   Neutro Abs 07/19/2022 3.7  1.7 - 7.7 K/uL Final   Lymphocytes Relative 07/19/2022 27  % Final   Lymphs Abs 07/19/2022 1.6  0.7 - 4.0 K/uL Final   Monocytes Relative 07/19/2022 10  % Final   Monocytes Absolute 07/19/2022 0.6  0.1 - 1.0 K/uL Final   Eosinophils Relative 07/19/2022 2  % Final  Eosinophils Absolute 07/19/2022 0.1  0.0 - 0.5 K/uL Final   Basophils Relative 07/19/2022 1  % Final   Basophils Absolute 07/19/2022 0.0  0.0 - 0.1 K/uL Final   Immature Granulocytes 07/19/2022 0  % Final   Abs Immature Granulocytes 07/19/2022 0.01  0.00 - 0.07 K/uL Final   Performed at [facility],  [address], [place], [place] [address]    RADIOGRAPHIC STUDIES: I have personally reviewed the radiological images as listed and agree with the findings in the report  No results found.  ASSESSMENT/PLAN  67 y.o. male is here because of MGUS, anemia, CKD history of small bowel cancer, colon polyps and recurrent VTE.      MGUS, IgA kappa- Diagnosed in June 2016  July 24 2019: SPEP with IEP demonstrated 0.2 g/dL of IgA kappa monoclonal protein.  Serum free kappa 108.2 lambda 36.1 with a kappa lambda 3.0 Ig G 1061 IgA 586 IgM 45 Beta 2 microglobulin 2.5 LDH 183 CMP notable for creatinine 1.8 WBC 7.8 globin 15.0 MCV 77 platelet count 213; 63 segs 26 lymphs monos 1 EO 1 basophil    July 31, 2019: 24-hour urine loss of 5298 mg of protein per day.  UPEP with IEP negative   August 07, 2019: Left posterior iliac crest bone marrow biopsy-essentially normocellular marrow for age (40 to 50%) orderly maturation.  Plasma cells constitute 5% of the marrow cellularity with a small percentage have kappa predominance   July 19, 2022: SPEP IEP 0.2 g/dL of IgA kappa monoclonal protein.  Serum free kappa 92.3 lambda 34.8 with a kappa lambda 2.65 IgG 909 IgA 477 IgM 41   August 07 2022- MGUS appears to be stable.  No clinical evidence to suggest myeloma.  Can consider 24 hr urine to evaluate for monoclonal protein.  Suspicion for amyloid low given normal LVEF    Anemia:  Likely multifactorial with possible causes being 1) iron deficiency owing to chronic blood loss exacerbated by anticoagulation 2) CKD 3) hemoglobinopathy  August 07 2022- Will obtain CBC with diff, Ferritin, B12, folate, retic count,  DAT, Haptoglobin,  Copper and Zinc levels, Hgb electrophoresis.     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  Treated with Lovenox, placement of IVC filter (later removed), and Coumadin.  Attributed to testosterone replacement  November 13 2021-  Large volume bilateral  pulmonary emboli with right heart strain (submassive PE).  Treated with heparin gtt.  Discharged on Eliquis.    Hypercoagulable state evaluation August 07 2002- Will obtain the following:  CBC with diff, CMP, PT, PTT, Factor V Leiden Gene, Prothrombin Gene, anti-beta2 glycolipoprotein, anticardiolipan antibody, d-dimer, ANA with reflex, RF.  Can consider PCR for bcr-abl, JAK 2 with reflex, flow for PNH, FVIII level.    Because of anticoagulation with a direct thrombin inhibitor can not obtain Protein C, Protein S, ATIII activities or Lupus Anticoagulant as those are interfered with by these agents.    Adenocarcinoma of small bowel, Stage II (T3 N0 M0)   September 2004- Treated with resection followed by adjuvant chemotherapy  July 19 2022- CEA 3.2   Cancer Staging  Small bowel cancer [facility]) Staging form: Small Intestine, AJCC 6th Edition - Clinical stage from 09/13/2002: Stage II (T3, N0, M0) - Signed by Dellia Beckwith, MD on 03/04/2021 Staged by: Managing physician Diagnostic confirmation: Positive histology Specimen type: Excision Histopathologic type: Adenocarcinoma, intestinal type Total positive nodes: 0 Stage prefix: Initial diagnosis Lymphatic vessel invasion (L): L0 -  No lymphatic vessel invasion Venous invasion (V): V0 - No venous invasion Prognostic indicators: Treated with surgery and adjuvant chemotherapy, incl doxorubicin    No problem-specific Assessment & Plan notes found for this encounter.    Orders Placed This Encounter  Procedures   Ferritin    Standing Status:   Future    Number of Occurrences:   1    Standing Expiration Date:   08/07/2023   Folate    Standing Status:   Future    Number of Occurrences:   1    Standing Expiration Date:   08/07/2023   Copper, serum    Standing Status:   Future    Number of Occurrences:   1    Standing Expiration Date:   08/07/2023   Vitamin B12    Standing Status:   Future    Number of Occurrences:   1    Standing  Expiration Date:   08/07/2023   Reticulocytes    Standing Status:   Future    Number of Occurrences:   1    Standing Expiration Date:   08/07/2023   Haptoglobin    Standing Status:   Future    Number of Occurrences:   1    Standing Expiration Date:   08/07/2023   Hgb Fractionation Cascade    Standing Status:   Future    Number of Occurrences:   1    Standing Expiration Date:   08/07/2023   APTT    Standing Status:   Future    Number of Occurrences:   1    Standing Expiration Date:   08/07/2023   Protime-INR    Standing Status:   Future    Number of Occurrences:   1    Standing Expiration Date:   08/07/2023   D-dimer, quantitative    Standing Status:   Future    Number of Occurrences:   1    Standing Expiration Date:   08/07/2023   Cardiolipin antibodies, IgM+IgG    Standing Status:   Future    Number of Occurrences:   1    Standing Expiration Date:   08/07/2023   Beta-2-glycoprotein i abs, IgG/M/A    Standing Status:   Future    Number of Occurrences:   1    Standing Expiration Date:   08/07/2023   Prothrombin gene mutation    Standing Status:   Future    Number of Occurrences:   1    Standing Expiration Date:   08/07/2023   Factor 5 leiden    Standing Status:   Future    Number of Occurrences:   1    Standing Expiration Date:   08/07/2023   ANA Comprehensive Panel    Standing Status:   Future    Number of Occurrences:   1    Standing Expiration Date:   08/07/2023   Rheumatoid factor    Standing Status:   Future    Number of Occurrences:   1    Standing Expiration Date:   08/07/2023   Testosterone    Standing Status:   Future    Number of Occurrences:   1    Standing Expiration Date:   08/07/2023   Direct antiglobulin test (not at [facility])    Standing Status:   Future    Number of Occurrences:   1    Standing Expiration Date:   08/07/2023      minutes was spent in patient care.  This included time spent preparing to see the patient (e.g., review of tests), obtaining and/or  reviewing separately obtained history, counseling and educating the patient/family/caregiver, ordering medications, tests, or procedures; documenting clinical information in the electronic or other health record, independently interpreting results and communicating results to the patient/family/caregiver as well as coordination of care.       All questions were answered. The patient knows to call the clinic with any problems, questions or concerns.  This note was electronically signed.    Loni Muse, MD  08/07/2022 10:06 AM

## 2022-08-18 NOTE — Progress Notes (Signed)
[facility] CancerFollow up  Visit:  Patient Care Team: Paulina Fusi, MD as PCP - General (Internal Medicine) Elvis Coil, MD as Consulting Physician (Nephrology)  CHIEF COMPLAINTS/PURPOSE OF CONSULTATION:  Oncology History  Small bowel cancer [facility])  09/13/2002 Cancer Staging   Staging form: Small Intestine, AJCC 6th Edition - Clinical stage from 09/13/2002: Stage II (T3, N0, M0) - Signed by Dellia Beckwith, MD on 03/04/2021 Staged by: Managing physician Diagnostic confirmation: Positive histology Specimen type: Excision Histopathologic type: Adenocarcinoma, intestinal type Total positive nodes: 0 Stage prefix: Initial diagnosis Lymphatic vessel invasion (L): L0 - No lymphatic vessel invasion Venous invasion (V): V0 - No venous invasion Prognostic indicators: Treated with surgery and adjuvant chemotherapy, incl doxorubicin   10/03/2002 Initial Diagnosis   Small bowel cancer (HCC)     HISTORY OF PRESENTING ILLNESS: Anthony Le 67 y.o. 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      July 24 2019: SPEP with IEP demonstrated 0.2 g/dL of IgA kappa monoclonal protein.  Serum free kappa 108.2 lambda 36.1 with a kappa lambda 3.0 Ig G 1061 IgA 586 IgM 45 Beta 2 microglobulin 2.5 LDH 183 CMP notable for creatinine 1.8 WBC 7.8 globin 15.0 MCV 77 platelet count 213; 63 segs 26 lymphs monos 1 EO 1 basophil  July 31, 2019: 24-hour urine showed loss of 5298 mg of protein per day.  UPEP with IEP negative  August 07, 2019: Left posterior iliac crest bone marrow biopsy-essentially normocellular marrow for age (40 to 50%) orderly maturation.   Plasma cells constitute 5% of the marrow cellularity with a small percentage have kappa predominance  November 13, 2021: Admitted to [facility] with massive recurrent PE with large clot burden resulting in right heart strain.  Patient was placed in ICU treated with heparin and discharged on Eliquis  CT PA- Large volume acute bilateral pulmonary emboli, including the distal right main pulmonary artery.  Clot burden is large.  Right heart strain (RV/LV Ratio = 1.57) consistent with at least submassive (intermediate risk) PE.   Cardiac ECHO- LV size normal.  Mild LVH.  LV ejection fraction = 50-55%.  No segmental wall motion abnormalities in LV.  RV normal in size and function. LA mildly dilated.  Mild pulmonic valvular regurgitation. .  Aortic Sinus(es) of Valsalva are mildly dilated. IVC size normal.    March 21, 2022: Diagnosed with nephropathy due to chronic NSAID use, hypertension  July 19, 2022: WBC 6.1 hemoglobin 13.3 MCV 79 platelet count 197; 60 segs 27 lymphs 10 monos 2 eos 1 basophil SPEP IEP demonstrated 0.2 g/dL of IgA kappa monoclonal protein.  Serum free kappa 92.3 lambda 34.8 with a kappa lambda 2.65 IgG 909 IgA 477 IgM 41 CEA 3.2 Chemistries notable for creatinine 1.9  August 07 2022:  Scheduled follow up for MGUS, anemia and VTE. Patient attributes first VTE to use of testosterone and the second VTE to the use of a gel he was using for severe pain in his knees.  He is currently on Xarelto 20 mg daily.    Currently has severe dental problems and needs extractions.    Social:  Hauls junk and mows yards.  Quit smoking 30 yrs ago.  EtOH has not drank anything in years but only rarely before that  [facility] Mother died 83 CVA Father died 38 colon cancer Brothers x 5- one brother had cancer in jaw, eldest brother had blood clots Sisters x 2 - no history of cancer Maternal Grandmother died of endometrial cancer  WBC 5.9 hemoglobin 13.1 MCV 80.6 platelet count 192; 65 segs 25 lymphs 6  monos 3 eos 1 basophil Coombs test negative haptoglobin 158 Hemoglobin electrophoresis test hemoglobin A 59% A2 3.4%  Hgb C 37.6% consistent with hemoglobin C trait Anticardiolipin antibody negative.  Beta-2 glycol protein antibody negative.   ANA panel and rheumatoid factor negative D-dimer 2.91 INR 1.2 PTT 32 Testosterone 389 Factor V Leiden and prothrombin gene mutation testing negative.   August 21 2022:  Scheduled follow up for MGUS and anemia.  Developed pruritus on Allopurinol and was placed on Febuxostat but can not afford it.   Not currently having a gouty attack.  May wish to consider using Probenecid for gout.  To be seen by dentist in about 2 weeks for consideration of extraction but is limited in extent of work which can be performed due to finances.     Review of Systems  Constitutional:  Negative for appetite change, chills, fatigue and fever.       Lost weight after change in antihypertensive by nephrology  HENT:   Negative for mouth sores, nosebleeds, sore throat, trouble swallowing and voice change.   Eyes:  Negative for eye problems and icterus.       Vision changes:  None  Respiratory:  Negative for chest tightness, cough, hemoptysis and wheezing.        DOE on trying to do things in a hurry  Cardiovascular:  Negative for chest pain and palpitations.       Has LE edema for which he takes a diuretic PRN  Gastrointestinal:  Negative for abdominal pain, blood in stool, constipation, diarrhea, nausea and vomiting.  Endocrine: Negative for hot flashes.       Cold intolerance:  none Heat intolerance:  none  Genitourinary:  Positive for frequency. Negative for bladder incontinence, difficulty urinating, dysuria, hematuria and nocturia.   Musculoskeletal:  Negative for arthralgias, back pain, gait problem, myalgias, neck pain and neck stiffness.  Skin:  Negative for rash and wound.       Sometimes had pruritus behind scrotum  Neurological:  Negative for dizziness, extremity  weakness, gait problem, headaches, light-headedness, numbness and speech difficulty.  Hematological:  Negative for adenopathy. Does not bruise/bleed easily.  Psychiatric/Behavioral:  Negative for sleep disturbance and suicidal ideas. The patient is not nervous/anxious.     MEDICAL HISTORY: Past Medical History:  Diagnosis Date   Allergy    sinus problems   Anemia    Arthritis    Blood transfusion without reported diagnosis 2005   Cancer [facility]) 2005   Small bowel Cancer   Clotting disorder (HCC) 2016   Depression    Diabetes mellitus without complication (HCC)    controlled by diet.   Diverticula, colon    Fatigue    Gout    history of gout   Hyperlipidemia    Hypertension    Memory loss    Muscle pain    and cramping   Osteoporosis    pt denies hx of this.   Sleep apnea    is not on a Cpap    SURGICAL HISTORY: Past Surgical History:  Procedure Laterality Date   bowel removal  CARPAL TUNNEL WITH CUBITAL TUNNEL     COLONOSCOPY  2016   left arm surgery     LEG SURGERY     NOSE SURGERY     SMALL INTESTINE SURGERY     cancer removed and resectioned    SOCIAL HISTORY: Social History   Socioeconomic History   Marital status: Legally Separated    Spouse name: Not on file   Number of children: Not on file   Years of education: Not on file   Highest education level: Not on file  Occupational History   Not on file  Tobacco Use   Smoking status: Former   Smokeless tobacco: Never  Vaping Use   Vaping status: Never Used  Substance and Sexual Activity   Alcohol use: No   Drug use: No   Sexual activity: Not on file  Other Topics Concern   Not on file  Social History Narrative   Not on file   Social Determinants of Health   Financial Resource Strain: Not on file  Food Insecurity: Not on file  Transportation Needs: Not on file  Physical Activity: Not on file  Stress: Not on file  Social Connections: Not on file  Intimate Partner Violence: Not on file     FAMILY HISTORY Family History  Problem Relation Age of Onset   Stroke Mother    Arthritis Mother    Diabetes Mother    Hypertension Mother    Cancer Father    Stroke Father    Hypertension Father    Gout Father    Colon cancer Father 28   Esophageal cancer Neg Hx    Rectal cancer Neg Hx    Stomach cancer Neg Hx     ALLERGIES:  has No Known Allergies.  MEDICATIONS:  Current Outpatient Medications  Medication Sig Dispense Refill   allopurinol (ZYLOPRIM) 300 MG tablet Take 1 tablet by mouth daily.     amLODipine (NORVASC) 5 MG tablet Take 5 mg by mouth every morning.     atorvastatin (LIPITOR) 80 MG tablet Take by mouth.     Cholecalciferol 125 MCG (5000 UT) TABS Take by mouth.     Febuxostat 80 MG TABS Take 1 tablet by mouth daily.     hydrocortisone (ANUSOL-HC) 2.5 % rectal cream Place 1 application rectally 2 (two) times daily. Hydrocortisone 2.5% cream rectal area twice daily after bowel movements for 10 days then as needed 30 g 0   losartan (COZAAR) 50 MG tablet Take by mouth.     Omega-3 1000 MG CAPS Take by mouth.     rivaroxaban (XARELTO) 20 MG TABS tablet Take 20 mg by mouth daily with supper.     torsemide (DEMADEX) 20 MG tablet Take 1 tablet by mouth daily. Taking 1/2 tab twice daily     No current facility-administered medications for this visit.    PHYSICAL EXAMINATION:  ECOG PERFORMANCE STATUS: 1 - Symptomatic but completely ambulatory   There were no vitals filed for this visit.   There were no vitals filed for this visit.    Physical Exam Vitals and nursing note reviewed.  Constitutional:      General: He is not in acute distress.    Appearance: Normal appearance. He is obese. He is not diaphoretic.     Comments: Here with daughter  HENT:     Head: Normocephalic and atraumatic.     Right Ear: External ear normal.     Left Ear: External ear normal.  Nose: Nose normal.  Eyes:     General: No scleral icterus.    Conjunctiva/sclera:  Conjunctivae normal.     Pupils: Pupils are equal, round, and reactive to light.  Cardiovascular:     Rate and Rhythm: Normal rate and regular rhythm.     Heart sounds:     No friction rub. No gallop.  Pulmonary:     Effort: Pulmonary effort is normal.     Breath sounds: Normal breath sounds. No wheezing, rhonchi or rales.  Abdominal:     Palpations: Abdomen is soft.     Tenderness: There is no abdominal tenderness. There is no guarding or rebound.  Musculoskeletal:     Cervical back: Normal range of motion and neck supple. No rigidity or tenderness.     Right lower leg: Edema present.     Left lower leg: Edema present.     Comments: Decreased ROM in knees due to arthritis  Lymphadenopathy:     Head:     Right side of head: No submental, submandibular, tonsillar, preauricular, posterior auricular or occipital adenopathy.     Left side of head: No submental, submandibular, tonsillar, preauricular, posterior auricular or occipital adenopathy.     Cervical: No cervical adenopathy.     Right cervical: No superficial, deep or posterior cervical adenopathy.    Left cervical: No superficial, deep or posterior cervical adenopathy.     Upper Body:     Right upper body: No supraclavicular or axillary adenopathy.     Left upper body: No supraclavicular or axillary adenopathy.     Lower Body: No right inguinal adenopathy. No left inguinal adenopathy.  Skin:    General: Skin is warm and dry.     Coloration: Skin is not jaundiced or pale.     Findings: No bruising.  Neurological:     General: No focal deficit present.     Mental Status: He is alert and oriented to person, place, and time.     Gait: Gait abnormal.     Comments: Gait abnormal owing to arthritis in knees  Psychiatric:        Mood and Affect: Mood normal.        Behavior: Behavior normal.        Thought Content: Thought content normal.        Judgment: Judgment normal.      LABORATORY DATA: I have personally reviewed the  data as listed:  Clinical Support on 08/07/2022  Component Date Value Ref Range Status   Testosterone 08/07/2022 389  264 - 916 ng/dL Final   Comment: (NOTE) Adult male reference interval is based on a population of healthy nonobese males (BMI <30) between 49 and 26 years old. Travison, et.al. JCEM [phone]. PMID: 91478295. Performed At: [facility] [address] [place], [place] [id number] Jolene Schimke MD QI:[id number]    Rheumatoid fact SerPl-aCnc 08/07/2022 <10.0  <14.0 IU/mL Final   Comment: (NOTE) Performed At: [facility] [address] [place], [place] [id number] Jolene Schimke MD OZ:[id number]    ds DNA Ab 08/07/2022 <1  0 - 9 IU/mL Final   Comment: (NOTE)                                   Negative      <5  Equivocal  5 - 9                                   Positive      >9    Ribonucleic Protein 08/07/2022 0.2  0.0 - 0.9 AI Final   ENA SM Ab Ser-aCnc 08/07/2022 <0.2  0.0 - 0.9 AI Final   Scleroderma (Scl-70) (ENA) Antibod* 08/07/2022 <0.2  0.0 - 0.9 AI Final   SSA (Ro) (ENA) Antibody, IgG 08/07/2022 <0.2  0.0 - 0.9 AI Final   SSB (La) (ENA) Antibody, IgG 08/07/2022 <0.2  0.0 - 0.9 AI Final   Chromatin Ab SerPl-aCnc 08/07/2022 <0.2  0.0 - 0.9 AI Final   Anti JO-1 08/07/2022 <0.2  0.0 - 0.9 AI Final   Centromere Ab Screen 08/07/2022 <0.2  0.0 - 0.9 AI Final   See below: 08/07/2022 Comment   Final   Comment: (NOTE) Autoantibody                       Disease Association ------------------------------------------------------------                        Condition                  Frequency ---------------------   ------------------------   --------- Antinuclear Antibody,    SLE, mixed connective Direct (ANA-D)           tissue diseases ---------------------   ------------------------   --------- dsDNA                    SLE                        40 - 60% ---------------------   ------------------------    --------- Chromatin                Drug induced SLE                90%                         SLE                        48 - 97% ---------------------   ------------------------   --------- SSA (Ro)                 SLE                        25 - 35%                         Sjogren's Syndrome         40 - 70%                         Neonatal Lupus                 100% ---------------------   ------------------------   --------- SSB (La)                 SLE  10%                         Sjogren's Syndrome              30% ---------------------   -----------------------    --------- Sm (anti-Smith)          SLE                        15 - 30% ---------------------   -----------------------    --------- RNP                      Mixed Connective Tissue                         Disease                         95% (U1 nRNP,                SLE                        30 - 50% anti-ribonucleoprotein)  Polymyositis and/or                         Dermatomyositis                 20% ---------------------   ------------------------   --------- Scl-70 (antiDNA          Scleroderma (diffuse)      20 - 35% topoisomerase)           Crest                           13% ---------------------   ------------------------   --------- Jo-1                     Polymyositis and/or                         Dermatomyositis            20 - 40% ---------------------   ------------------------   --------- Centromere B             Scleroderma -                           Crest                         variant                         80% Performed At: [facility] [address] [place], [place] [id number] Jolene Schimke MD DG:[id number]    Recommendations-F5LEID: 08/07/2022 Comment   Final   Comment: (NOTE) Result: c.1601G>A (p.Arg534Gln) - Not Detected This result is not associated with an increased risk for venous thromboembolism. See Additional  Clinical Information and Comments. Additional Clinical Information: Venous thromboembolism is a multifactorial disease influenced by genetic, environmental, and circumstantial risk factors. The c.1601G>A (p. Arg534Gln) variant in the F5 gene, commonly referred to as Factor V Leiden, is a genetic risk factor for venous thromboembolism. Heterozygous carriers of this variant have a 6- to 8-  fold increased risk for venous thromboembolism. Individuals homozygous for this variant (ie, with a copy of the variant on each chromosome) have an approximately 80-fold increased risk for venous thromboembolism. Individuals who carry both a c.*97G>A variant in the F2 gene and Factor V Leiden have an approximately 20-fold increased risk for venous thromboembolism. Risks are likely to be even higher in more complex genotype combinations in                          volving the F2 c.*97G>A variant and Factor V Leiden (PMID: 81191478). Additional risk factors include but are not limited to: deficiency of protein C, protein S, or antithrombin III, age, male sex, personal or family history of deep vein thromboembolism, smoking, surgery, prolonged immobilization, malignant neoplasm, tamoxifen treatment, raloxifene treatment, oral contraceptive use, hormone replacement therapy, and pregnancy. Management of thrombotic risk and thrombotic events should follow established guidelines and fit the clinical circumstance. This result cannot predict the occurrence or recurrence of a thrombotic event. Comment: Genetic counseling is recommended to discuss the potential clinical implications of positive results, as well as recommendations for testing family members. Genetic Coordinators are available for health care providers to discuss results at [phone]). Test Details: Variant Analyzed: c.1601G>A (p. Arg534Gln), referred to as Fact                          or V Leiden Methods/Limitations: DNA analysis  of the F5 gene (NM_000130.5) was performed by PCR amplification followed by restriction enzyme analysis. The diagnostic sensitivity is >99%. Results must be combined with clinical information for the most accurate interpretation. Molecular- based testing is highly accurate, but as in any laboratory test, diagnostic errors may occur. False positive or false negative results may occur for reasons that include genetic variants, blood transfusions, bone marrow transplantation, somatic or tissue-specific mosaicism, mislabeled samples, or erroneous representation of family relationships. This test was developed and its performance characteristics determined by [facility]. It has not been cleared or approved by the Food and Drug Administration. References: Dewitt Hoes [facility], Valentina Lucks [facility]; ACMG Professional Practice and Guidelines Committee. Addendum: [employer] of Medical Genetics consensus statement on fac                          tor V Leiden mutation testing. Genet Med. 2021 Mar 5. doi: 21.3086/V78469-629- 01108-x. PMID: 52841324. Cherrie Gauze. Factor V Leiden Thrombophilia. 1999 May 14 (Updated 2018 Jan 4). In: Bufford Buttner, Ardinger HH, Pagon RA, et al., editors. GeneReviews(R) (Internet). [address] ([place]): [place], [place]; 4010-2725. Available from: [email or web address]-[email or web address]/ Nita Sickle, Blanca Friend, Alvie Heidelberg CS; ACMG Laboratory Quality Assurance Committee. Venous thromboembolism laboratory testing (factor V Leiden and factor II c.*97G>A), 2018 update: a technical standard of the [employer] of [employer] and Genomics (ACMG). Genet Med. 2018 Dec;20(12):1489-1498. doi: 10.1038/s41436-747-011-5650-z. Epub 2018 Oct 5. PMID: 36644034.    Reviewed By: 08/07/2022 Comment   Final   Comment: (NOTE) Technical Component performed at [employer] [facility] Professional Component performed by: [facility]  [employer] Alpha Gula, Ph.D., [facility] Director, Molecular Genetics [address] [place] [address] Performed At: [facility] [address] [place], [place] [id number] Maurine Simmering MDPhD PI:[id number]    Recommendations-PTGENE: 08/07/2022 Comment   Final   Comment: (NOTE) Result: c.*97G>A - Not Detected This result is  not associated with an increased risk for venous thromboembolism. See Additional Clinical Information and Comments. Additional Clinical Information: Venous thromboembolism is a multifactorial disease influenced by genetic, environmental, and circumstantial risk factors. The c.*97G>A variant in the F2 gene is a genetic risk factor for venous thromboembolism. Heterozygous carriers have a 2- to 4-fold increased risk for venous thromboembolism. Homozygotes for the c.*97G>A variant are rare. The annual risk of VTE in homozygotes has been reported to be 1.1%/year. Individuals who carry both a c.*97G>A variant in the F2 gene and a c.1601G>A (p. Arg534Gln) variant in the F5 gene (commonly referred to as Factor V Leiden) have an approximately 20- fold increased risk for venous thromboembolism. Risks are likely to be even higher in more complex genotype combinations involving the F2 c.*97G>A variant and Factor V Leiden (PMID:                           16109604). Additional risk factors include but are not limited to: deficiency of protein C, protein S, or antithrombin III, age, male sex, personal or family history of deep vein thromboembolism, smoking, surgery, prolonged immobilization, malignant neoplasm, tamoxifen treatment, raloxifene treatment, oral contraceptive use, hormone replacement therapy, and pregnancy. Management of thrombotic risk and thrombotic events should follow established guidelines and fit the clinical circumstance. This result cannot predict the occurrence or recurrence of a thrombotic event. Comments: Genetic counseling is recommended to  discuss the potential clinical implications of positive results, as well as recommendations for testing family members. Genetic Coordinators are available for health care providers to discuss results at [phone]). Test Details: Variant analyzed: c.*97G>A, previously referred to as G20210A Methods/Limitations: DNA analysis of the F2 gene (NM_000                          506.5) was performed by PCR amplification followed by restriction enzyme analysis. The diagnostic sensitivity is >99%. Results must be combined with clinical information for the most accurate interpretation. Molecular-based testing is highly accurate, but as in any laboratory test, diagnostic errors may occur. False positive or false negative results may occur for reasons that include genetic variants, blood transfusions, bone marrow transplantation, somatic or tissue-specific mosaicism, mislabeled samples, or erroneous representation of family relationships. This test was developed and its performance characteristics determined by [facility]. It has not been cleared or approved by the Food and Drug Administration. References: Dewitt Hoes [facility], Valentina Lucks [facility]; ACMG Professional Practice and Guidelines Committee. Addendum: [employer] of Medical Genetics consensus statement on factor V Leiden mutation testing. Genet Med. 2021 Mar 5. doi: 81.1914/N829                          36-021-01108-x. PMID: 56213086. Cherrie Gauze. Prothrombin Thrombophilia. 2006 Jul 25 [Updated 2021 Feb 4]. In: Bufford Buttner, Ardinger HH, Pagon RA, et al., editors. GeneReviews(R) [Internet]. [address] ([place]): [place], [place]; 5784-6962. Available from: [email or web address].com/ Nita Sickle, Blanca Friend, Alvie Heidelberg CS; ACMG Laboratory Quality Assurance Committee. Venous thromboembolism laboratory testing (factor V Leiden and factor II c.*97G>A), 2018 update: a  technical standard of the [employer] of [employer] and Genomics (ACMG). Genet Med. 2018 Dec;20(12):1489-1498. doi: 10.1038/s41436-669-459-7589-z. Epub 2018 Oct 5. PMID: 95284132.    Reviewed by: 08/07/2022 Comment   Final   Comment: (NOTE) Technical Component performed at [facility] Professional Component performed by:  [facility] [place] W. Harlan Stains, Ph.D., [facility] Director, Molecular Genetics [phone] [place] [address] [place] [address] Performed At: [facility] [address] [place], [place] [id number] Maurine Simmering MDPhD YQ:[id number]    Beta-2 Glyco I IgG 08/07/2022 <9  0 - 20 GPI IgG units Final   Comment: (NOTE) The reference interval reflects a 3SD or 99th percentile interval, which is thought to represent a potentially clinically significant result in accordance with the International Consensus Statement on the classification criteria for definitive antiphospholipid syndrome (APS). J Thromb Haem 2006;4:295-306.    Beta-2-Glycoprotein I IgM 08/07/2022 <9  0 - 32 GPI IgM units Final   Comment: (NOTE) The reference interval reflects a 3SD or 99th percentile interval, which is thought to represent a potentially clinically significant result in accordance with the International Consensus Statement on the classification criteria for definitive antiphospholipid syndrome (APS). J Thromb Haem 2006;4:295-306. Performed At: [facility] [address] [place], [place] [id number] Jolene Schimke MD RJ:[id number]    Beta-2-Glycoprotein I IgA 08/07/2022 <9  0 - 25 GPI IgA units Final   Comment: (NOTE) The reference interval reflects a 3SD or 99th percentile interval, which is thought to represent a potentially clinically significant result in accordance with the International Consensus Statement on the classification criteria for definitive antiphospholipid syndrome (APS). J Thromb Haem 2006;4:295-306.    Anticardiolipin IgG 08/07/2022  <9  0 - 14 GPL U/mL Final   Comment: (NOTE)                          Negative:              <15                          Indeterminate:     15 - 20                          Low-Med Positive: >20 - 80                          High Positive:         >80    Anticardiolipin IgM 08/07/2022 <9  0 - 12 MPL U/mL Final   Comment: (NOTE)                          Negative:              <13                          Indeterminate:     13 - 20                          Low-Med Positive: >20 - 80                          High Positive:         >80 Performed At: [facility] [address] [place], [place] [id number] Jolene Schimke MD TF:[id number]    D-Dimer, Quant 08/07/2022 2.91 (H)  0.00 - 0.50 ug/mL-FEU Final   Comment: (NOTE) At the manufacturer cut-off value of 0.5 g/mL FEU, this assay has a negative predictive value of 95-100%.This assay is intended  for use in conjunction with a clinical pretest probability (PTP) assessment model to exclude pulmonary embolism (PE) and deep venous thrombosis (DVT) in outpatients suspected of PE or DVT. Results should be correlated with clinical presentation. Performed at [facility], [address] [place], [place] [address]    Prothrombin Time 08/07/2022 15.8 (H)  11.4 - 15.2 seconds Final   INR 08/07/2022 1.2  0.8 - 1.2 Final   Comment: (NOTE) INR goal varies based on device and disease states. Performed at [facility], [address] [place], [place] [address]    aPTT 08/07/2022 32  24 - 36 seconds Final   Performed at [facility], [address], [place], [place] [address]   Hgb F 08/07/2022 Reflexed  0.0 - 2.0 % Final   Hgb A 08/07/2022 Reflexed  96.4 - 98.8 % Final   Hgb A2 08/07/2022 Reflexed  1.8 - 3.2 % Final   Hgb S 08/07/2022 Reflexed  0.0 % Final   Interpretation, Hgb Fract 08/07/2022 Comment   Final   Comment: (NOTE) Capillary Electrophoresis (CE) performed. Reflex to  High-pressure liquid chromatography (HPLC) method for confirmation. Performed At: [facility] [address] [place], [place] [id number] Jolene Schimke MD BM:[id number]    Haptoglobin 08/07/2022 165  32 - 363 mg/dL Final   Comment: (NOTE) Performed At: [facility] [address] [place], [place] [id number] Jolene Schimke MD IH:[id number]    Retic Ct Pct 08/07/2022 2.1  0.4 - 3.1 % Final   RBC. 08/07/2022 4.64  4.22 - 5.81 MIL/uL Final   Retic Count, Absolute 08/07/2022 98.4  19.0 - 186.0 K/uL Final   Immature Retic Fract 08/07/2022 29.3 (H)  2.3 - 15.9 % Final   Performed at [facility], [address] [place], [place] [address]   Vitamin B-12 08/07/2022 244  180 - 914 pg/mL Final   Comment: (NOTE) This assay is not validated for testing neonatal or myeloproliferative syndrome specimens for Vitamin B12 levels. Performed at [facility], [address], [place], [place] [address]    Copper 08/07/2022 90  69 - 132 ug/dL Final   Comment: (NOTE) This test was developed and its performance characteristics determined by [facility]. It has not been cleared or approved by the Food and Drug Administration.                                Detection Limit = 5 Performed At: [facility] [address] [place], [place] [id number] Jolene Schimke MD KZ:[id number]    DAT, complement 08/07/2022 NEG   Final   DAT, IgG 08/07/2022    Final                   Value:NEG Performed at [facility], [address], [place], [place] [address]    Folate 08/07/2022 20.1  >5.9 ng/mL Final   Performed at [facility] [address], [place], [place] [address]   Ferritin 08/07/2022 189  24 - 336 ng/mL Final   Performed at [facility], [address], [place], [place] [address]   WBC Count 08/07/2022 5.9  4.0 - 10.5 K/uL Final   RBC 08/07/2022 4.64  4.22 - 5.81 MIL/uL Final    Hemoglobin 08/07/2022 13.1  13.0 - 17.0 g/dL Final   HCT 62/83/1517 37.4 (L)  39.0 - 52.0 % Final   MCV  08/07/2022 80.6  80.0 - 100.0 fL Final   MCH 08/07/2022 28.2  26.0 - 34.0 pg Final   MCHC 08/07/2022 35.0  30.0 - 36.0 g/dL Final   RDW 01/11/7251 16.0 (H)  11.5 - 15.5 % Final   Platelet Count 08/07/2022 192  150 - 400 K/uL Final   nRBC 08/07/2022 0.0  0.0 - 0.2 % Final   Neutrophils Relative % 08/07/2022 65  % Final   Neutro Abs 08/07/2022 3.8  1.7 - 7.7 K/uL Final   Lymphocytes Relative 08/07/2022 25  % Final   Lymphs Abs 08/07/2022 1.4  0.7 - 4.0 K/uL Final   Monocytes Relative 08/07/2022 6  % Final   Monocytes Absolute 08/07/2022 0.4  0.1 - 1.0 K/uL Final   Eosinophils Relative 08/07/2022 3  % Final   Eosinophils Absolute 08/07/2022 0.2  0.0 - 0.5 K/uL Final   Basophils Relative 08/07/2022 1  % Final   Basophils Absolute 08/07/2022 0.1  0.0 - 0.1 K/uL Final   Immature Granulocytes 08/07/2022 0  % Final   Abs Immature Granulocytes 08/07/2022 0.01  0.00 - 0.07 K/uL Final   Performed at [facility], [address] [place], [place] [address]   Hgb F 08/07/2022 0.0  0.0 - 2.0 % Final   Hgb A 08/07/2022 59.0 (L)  96.4 - 98.8 % Final   Hgb A2 08/07/2022 3.4 (H)  1.8 - 3.2 % Final   Hgb S 08/07/2022 0.0  0.0 % Final   Hgb C 08/07/2022 37.6 (H)  0.0 % Final   Hgb E 08/07/2022 0.0  0.0 % Final   Hgb Variant 08/07/2022 0.0  0.0 % Final   Final Interpretation: 08/07/2022 Comment   Final   Comment: (NOTE) Hemoglobin pattern and concentration are consistent with Hgb C trait (heterozygous). Suggest clinical and hematologic correlation.            Hgb C-Trait Interpretation Ranges            Hgb A      50.0 - 70.0%            Hgb C      30.0 - 45.0%            Hgb A2      2.0 -  4.5% Performed At: [facility] [address] [place], [place] [id number] Jolene Schimke MD LO:[id number]     RADIOGRAPHIC STUDIES: I have personally reviewed the radiological  images as listed and agree with the findings in the report  No results found.  ASSESSMENT/PLAN  67 y.o. male is here because of MGUS, anemia, CKD history of small bowel cancer, colon polyps and recurrent VTE.      MGUS, IgA kappa- Diagnosed in June 2016  July 24 2019: SPEP with IEP demonstrated 0.2 g/dL of IgA kappa monoclonal protein.  Serum free kappa 108.2 lambda 36.1 with a kappa lambda 3.0 Ig G 1061 IgA 586 IgM 45 Beta 2 microglobulin 2.5 LDH 183 CMP notable for creatinine 1.8 WBC 7.8 globin 15.0 MCV 77 platelet count 213; 63 segs 26 lymphs monos 1 EO 1 basophil    July 31, 2019: 24-hour urine loss of 5298 mg of protein per day.  UPEP with IEP negative   August 07, 2019: Left posterior iliac crest bone marrow biopsy-essentially normocellular marrow for age (40 to 50%) orderly maturation.  Plasma cells constitute 5% of the marrow cellularity with a small percentage have kappa predominance   July 19, 2022: SPEP  IEP 0.2 g/dL of IgA kappa monoclonal protein.  Serum free kappa 92.3 lambda 34.8 with a kappa lambda 2.65 IgG 909 IgA 477 IgM 41   August 07 2022- MGUS appears to be stable.  No clinical evidence to suggest myeloma.  Can consider 24 hr urine to evaluate for monoclonal protein.  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 This mutation makes Hb C less soluble than Hb A, forming hexagonal crystals (HbC crystals as seen in the peripheral smear).     The mutation present in some of the hemoglobinopathies (Hb S, Hb C, Hb E) may be an evolutionary modification due to the effect of some selective external forces like malaria as in the heterozygous form,  these mutations protect carriers from dying of malarial infection. Hemoglobin C trait (Hgb AC):  Generally asymptomatic (phenotypically normal).  Hgb concentrations are usually within the low normal to the normal range. Life span of RBC's are decreased but reticulocyte counts are not increased. Peripheral smear may show target cells and intracellular crystals.   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  Treated with Lovenox, placement of IVC filter (later removed), and Coumadin.  Attributed to testosterone replacement  November 13 2021-  Large volume bilateral pulmonary emboli with right heart strain (submassive PE).  Treated with heparin gtt.  Discharged on Eliquis.    Hypercoagulable state evaluation August 07 2002-  Anticardiolipin antibody negative.  Beta-2 glycol protein antibody negative.   ANA panel and rheumatoid factor negative D-dimer 2.91 INR 1.2 PTT 32 Testosterone 389 Factor V Leiden and prothrombin gene mutation testing negative.  Because of anticoagulation with a direct thrombin inhibitor can not obtain Protein C, Protein S, ATIII activities or Lupus Anticoagulant as those are interfered with by these agents.    Can consider PCR for bcr-abl, JAK 2 with reflex, flow for PNH, FVIII level.    Gout  August 21 2022- Developed pruritus on Allopurinol and was placed on Febuxostat but can not afford it. Not currently having a gouty attack. May wish to consider using Probenecid  Adenocarcinoma of small bowel, Stage II (T3 N0 M0)   September 2004- Treated with resection followed by adjuvant chemotherapy  July 19 2022- CEA 3.2   Cancer Staging  Small bowel cancer [facility]) Staging form: Small Intestine, AJCC 6th Edition - Clinical stage from 09/13/2002: Stage II (T3, N0, M0) - Signed by Dellia Beckwith, MD on 03/04/2021 Staged by: Managing physician Diagnostic confirmation: Positive histology Specimen type: Excision Histopathologic type:  Adenocarcinoma, intestinal type Total positive nodes: 0 Stage prefix: Initial diagnosis Lymphatic vessel invasion (L): L0 - No lymphatic vessel invasion Venous invasion (V): V0 - No venous invasion Prognostic indicators: Treated with surgery and adjuvant chemotherapy, incl doxorubicin    No problem-specific Assessment & Plan notes found for this encounter.    No orders of the defined types were placed in this encounter.   40  minutes was spent in patient care.  This included time spent preparing to see the patient (e.g., review of tests), obtaining and/or reviewing separately obtained history, counseling and educating the patient/family/caregiver, ordering medications, tests, or procedures;  documenting clinical information in the electronic or other health record, independently interpreting results and communicating results to the patient/family/caregiver as well as coordination of care.       All questions were answered. The patient knows to call the clinic with any problems, questions or concerns.  This note was electronically signed.    Loni Muse, MD  08/18/2022 5:41 PM

## 2022-08-21 ENCOUNTER — Inpatient Hospital Stay: Payer: PPO | Attending: Hematology and Oncology | Admitting: Oncology

## 2022-08-21 ENCOUNTER — Inpatient Hospital Stay: Payer: PPO

## 2022-08-21 VITALS — BP 134/90 | HR 87 | Temp 97.6°F | Resp 18 | Ht 74.0 in | Wt 285.3 lb

## 2022-08-21 DIAGNOSIS — Z7901 Long term (current) use of anticoagulants: Secondary | ICD-10-CM

## 2022-08-21 DIAGNOSIS — M109 Gout, unspecified: Secondary | ICD-10-CM | POA: Diagnosis not present

## 2022-08-21 DIAGNOSIS — D649 Anemia, unspecified: Secondary | ICD-10-CM

## 2022-08-21 DIAGNOSIS — D6859 Other primary thrombophilia: Secondary | ICD-10-CM

## 2022-08-21 DIAGNOSIS — D582 Other hemoglobinopathies: Secondary | ICD-10-CM | POA: Diagnosis not present

## 2022-08-21 DIAGNOSIS — D472 Monoclonal gammopathy: Secondary | ICD-10-CM | POA: Diagnosis not present

## 2022-09-06 DIAGNOSIS — D582 Other hemoglobinopathies: Secondary | ICD-10-CM | POA: Insufficient documentation

## 2022-09-14 DIAGNOSIS — U071 COVID-19: Secondary | ICD-10-CM | POA: Diagnosis not present

## 2022-09-15 DIAGNOSIS — J019 Acute sinusitis, unspecified: Secondary | ICD-10-CM | POA: Diagnosis not present

## 2022-09-15 DIAGNOSIS — R509 Fever, unspecified: Secondary | ICD-10-CM | POA: Diagnosis not present

## 2022-09-15 DIAGNOSIS — R051 Acute cough: Secondary | ICD-10-CM | POA: Diagnosis not present

## 2022-09-15 DIAGNOSIS — R0981 Nasal congestion: Secondary | ICD-10-CM | POA: Diagnosis not present

## 2022-10-03 DIAGNOSIS — I2699 Other pulmonary embolism without acute cor pulmonale: Secondary | ICD-10-CM | POA: Diagnosis not present

## 2022-10-03 DIAGNOSIS — E1142 Type 2 diabetes mellitus with diabetic polyneuropathy: Secondary | ICD-10-CM | POA: Diagnosis not present

## 2022-10-03 DIAGNOSIS — N183 Chronic kidney disease, stage 3 unspecified: Secondary | ICD-10-CM | POA: Diagnosis not present

## 2022-10-03 DIAGNOSIS — E785 Hyperlipidemia, unspecified: Secondary | ICD-10-CM | POA: Diagnosis not present

## 2022-10-03 DIAGNOSIS — M1A9XX0 Chronic gout, unspecified, without tophus (tophi): Secondary | ICD-10-CM | POA: Diagnosis not present

## 2022-10-27 DIAGNOSIS — I129 Hypertensive chronic kidney disease with stage 1 through stage 4 chronic kidney disease, or unspecified chronic kidney disease: Secondary | ICD-10-CM | POA: Diagnosis not present

## 2022-10-27 DIAGNOSIS — N183 Chronic kidney disease, stage 3 unspecified: Secondary | ICD-10-CM | POA: Diagnosis not present

## 2022-10-30 DIAGNOSIS — R801 Persistent proteinuria, unspecified: Secondary | ICD-10-CM | POA: Diagnosis not present

## 2022-10-30 DIAGNOSIS — N183 Chronic kidney disease, stage 3 unspecified: Secondary | ICD-10-CM | POA: Diagnosis not present

## 2022-10-30 DIAGNOSIS — Z8739 Personal history of other diseases of the musculoskeletal system and connective tissue: Secondary | ICD-10-CM | POA: Diagnosis not present

## 2022-10-30 DIAGNOSIS — D472 Monoclonal gammopathy: Secondary | ICD-10-CM | POA: Diagnosis not present

## 2022-10-30 DIAGNOSIS — I129 Hypertensive chronic kidney disease with stage 1 through stage 4 chronic kidney disease, or unspecified chronic kidney disease: Secondary | ICD-10-CM | POA: Diagnosis not present

## 2022-11-22 ENCOUNTER — Inpatient Hospital Stay: Payer: PPO | Attending: Hematology and Oncology

## 2022-11-22 ENCOUNTER — Inpatient Hospital Stay (HOSPITAL_BASED_OUTPATIENT_CLINIC_OR_DEPARTMENT_OTHER): Payer: PPO | Admitting: Oncology

## 2022-11-22 VITALS — BP 143/97 | HR 61 | Temp 97.6°F | Resp 16 | Ht 74.0 in | Wt 287.3 lb

## 2022-11-22 DIAGNOSIS — Z79890 Hormone replacement therapy (postmenopausal): Secondary | ICD-10-CM | POA: Diagnosis not present

## 2022-11-22 DIAGNOSIS — D6851 Activated protein C resistance: Secondary | ICD-10-CM | POA: Insufficient documentation

## 2022-11-22 DIAGNOSIS — I2692 Saddle embolus of pulmonary artery without acute cor pulmonale: Secondary | ICD-10-CM | POA: Diagnosis not present

## 2022-11-22 DIAGNOSIS — M810 Age-related osteoporosis without current pathological fracture: Secondary | ICD-10-CM | POA: Insufficient documentation

## 2022-11-22 DIAGNOSIS — M109 Gout, unspecified: Secondary | ICD-10-CM | POA: Insufficient documentation

## 2022-11-22 DIAGNOSIS — E1122 Type 2 diabetes mellitus with diabetic chronic kidney disease: Secondary | ICD-10-CM | POA: Insufficient documentation

## 2022-11-22 DIAGNOSIS — Z86711 Personal history of pulmonary embolism: Secondary | ICD-10-CM | POA: Insufficient documentation

## 2022-11-22 DIAGNOSIS — Z860100 Personal history of colon polyps, unspecified: Secondary | ICD-10-CM | POA: Diagnosis not present

## 2022-11-22 DIAGNOSIS — D649 Anemia, unspecified: Secondary | ICD-10-CM | POA: Diagnosis not present

## 2022-11-22 DIAGNOSIS — I371 Nonrheumatic pulmonary valve insufficiency: Secondary | ICD-10-CM | POA: Diagnosis not present

## 2022-11-22 DIAGNOSIS — Z7901 Long term (current) use of anticoagulants: Secondary | ICD-10-CM | POA: Diagnosis not present

## 2022-11-22 DIAGNOSIS — Z85038 Personal history of other malignant neoplasm of large intestine: Secondary | ICD-10-CM | POA: Insufficient documentation

## 2022-11-22 DIAGNOSIS — I129 Hypertensive chronic kidney disease with stage 1 through stage 4 chronic kidney disease, or unspecified chronic kidney disease: Secondary | ICD-10-CM | POA: Diagnosis not present

## 2022-11-22 DIAGNOSIS — N183 Chronic kidney disease, stage 3 unspecified: Secondary | ICD-10-CM | POA: Diagnosis not present

## 2022-11-22 DIAGNOSIS — D6859 Other primary thrombophilia: Secondary | ICD-10-CM | POA: Diagnosis not present

## 2022-11-22 DIAGNOSIS — E785 Hyperlipidemia, unspecified: Secondary | ICD-10-CM | POA: Insufficient documentation

## 2022-11-22 DIAGNOSIS — I824Z2 Acute embolism and thrombosis of unspecified deep veins of left distal lower extremity: Secondary | ICD-10-CM

## 2022-11-22 DIAGNOSIS — D631 Anemia in chronic kidney disease: Secondary | ICD-10-CM | POA: Diagnosis not present

## 2022-11-22 DIAGNOSIS — D472 Monoclonal gammopathy: Secondary | ICD-10-CM

## 2022-11-22 DIAGNOSIS — D582 Other hemoglobinopathies: Secondary | ICD-10-CM | POA: Diagnosis not present

## 2022-11-22 DIAGNOSIS — Z800 Family history of malignant neoplasm of digestive organs: Secondary | ICD-10-CM | POA: Diagnosis not present

## 2022-11-22 DIAGNOSIS — Z79899 Other long term (current) drug therapy: Secondary | ICD-10-CM | POA: Diagnosis not present

## 2022-11-22 DIAGNOSIS — G4730 Sleep apnea, unspecified: Secondary | ICD-10-CM | POA: Diagnosis not present

## 2022-11-22 LAB — CMP (CANCER CENTER ONLY)
ALT: 14 U/L (ref 0–44)
AST: 26 U/L (ref 15–41)
Albumin: 4 g/dL (ref 3.5–5.0)
Alkaline Phosphatase: 92 U/L (ref 38–126)
Anion gap: 9 (ref 5–15)
BUN: 25 mg/dL — ABNORMAL HIGH (ref 8–23)
CO2: 24 mmol/L (ref 22–32)
Calcium: 9.6 mg/dL (ref 8.9–10.3)
Chloride: 108 mmol/L (ref 98–111)
Creatinine: 1.93 mg/dL — ABNORMAL HIGH (ref 0.61–1.24)
GFR, Estimated: 37 mL/min — ABNORMAL LOW (ref >60)
Glucose, Bld: 97 mg/dL (ref 70–99)
Potassium: 4.8 mmol/L (ref 3.5–5.1)
Sodium: 142 mmol/L (ref 135–145)
Total Bilirubin: 0.7 mg/dL (ref <1.2)
Total Protein: 6.7 g/dL (ref 6.5–8.1)

## 2022-11-22 LAB — D-DIMER, QUANTITATIVE: D-Dimer, Quant: 2.54 ug{FEU}/mL — ABNORMAL HIGH (ref 0.00–0.50)

## 2022-11-22 LAB — CBC WITH DIFFERENTIAL (CANCER CENTER ONLY)
Abs Immature Granulocytes: 0.01 10*3/uL (ref 0.00–0.07)
Basophils Absolute: 0.1 10*3/uL (ref 0.0–0.1)
Basophils Relative: 1 %
Eosinophils Absolute: 0.2 10*3/uL (ref 0.0–0.5)
Eosinophils Relative: 3 %
HCT: 37.6 % — ABNORMAL LOW (ref 39.0–52.0)
Hemoglobin: 13.6 g/dL (ref 13.0–17.0)
Immature Granulocytes: 0 %
Lymphocytes Relative: 24 %
Lymphs Abs: 1.5 10*3/uL (ref 0.7–4.0)
MCH: 28.1 pg (ref 26.0–34.0)
MCHC: 36.2 g/dL — ABNORMAL HIGH (ref 30.0–36.0)
MCV: 77.7 fL — ABNORMAL LOW (ref 80.0–100.0)
Monocytes Absolute: 0.5 10*3/uL (ref 0.1–1.0)
Monocytes Relative: 8 %
Neutro Abs: 4.1 10*3/uL (ref 1.7–7.7)
Neutrophils Relative %: 64 %
Platelet Count: 182 10*3/uL (ref 150–400)
RBC: 4.84 MIL/uL (ref 4.22–5.81)
RDW: 15.2 % (ref 11.5–15.5)
WBC Count: 6.4 10*3/uL (ref 4.0–10.5)
nRBC: 0 % (ref 0.0–0.2)
nRBC: 0 /100{WBCs}

## 2022-11-22 LAB — FERRITIN: Ferritin: 164 ng/mL (ref 24–336)

## 2022-11-22 LAB — VITAMIN B12: Vitamin B-12: 369 pg/mL (ref 180–914)

## 2022-11-22 LAB — FOLATE: Folate: 18.7 ng/mL (ref >5.9)

## 2022-11-22 NOTE — Progress Notes (Signed)
[facility] CancerFollow up  Visit:  Patient Care Team: Paulina Fusi, MD as PCP - General (Internal Medicine) Elvis Coil, MD as Consulting Physician (Nephrology)  CHIEF COMPLAINTS/PURPOSE OF CONSULTATION:  Oncology History  Small bowel cancer [facility])  09/13/2002 Cancer Staging   Staging form: Small Intestine, AJCC 6th Edition - Clinical stage from 09/13/2002: Stage II (T3, N0, M0) - Signed by Dellia Beckwith, MD on 03/04/2021 Staged by: Managing physician Diagnostic confirmation: Positive histology Specimen type: Excision Histopathologic type: Adenocarcinoma, intestinal type Total positive nodes: 0 Stage prefix: Initial diagnosis Lymphatic vessel invasion (L): L0 - No lymphatic vessel invasion Venous invasion (V): V0 - No venous invasion Prognostic indicators: Treated with surgery and adjuvant chemotherapy, incl doxorubicin   10/03/2002 Initial Diagnosis   Small bowel cancer (HCC)     HISTORY OF PRESENTING ILLNESS: [facility] Le 67 y.o. 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      July 24 2019: SPEP with IEP demonstrated 0.2 g/dL of IgA kappa monoclonal protein.  Serum free kappa 108.2 lambda 36.1 with a kappa lambda 3.0 IgG 1061 IgA 586 IgM 45 Beta 2 microglobulin 2.5 LDH 183 CMP notable for creatinine 1.8 WBC 7.8 globin 15.0 MCV 77 platelet count 213; 63 segs 26 lymphs monos 1 EO 1 basophil  July 31, 2019: 24-hour urine showed loss of 5298 mg of protein per day.  UPEP with IEP negative  August 07, 2019: Left posterior iliac crest bone marrow biopsy-essentially normocellular marrow for age (40 to 50%) orderly maturation.   Plasma cells constitute 5% of the marrow cellularity with a small percentage have kappa predominance  November 13, 2021: Admitted to [facility] with massive recurrent PE with large clot burden resulting in right heart strain.  Patient was placed in ICU treated with heparin and discharged on Eliquis  CT PA- Large volume acute bilateral pulmonary emboli, including the distal right main pulmonary artery.  Clot burden is large.  Right heart strain (RV/LV Ratio = 1.57) consistent with at least submassive (intermediate risk) PE.   Cardiac ECHO- LV size normal.  Mild LVH.  LV ejection fraction = 50-55%.  No segmental wall motion abnormalities in LV.  RV normal in size and function. LA mildly dilated.  Mild pulmonic valvular regurgitation. .  Aortic Sinus(es) of Valsalva are mildly dilated. IVC size normal.    March 21, 2022: Diagnosed with nephropathy due to chronic NSAID use, hypertension  July 19, 2022: WBC 6.1 hemoglobin 13.3 MCV 79 platelet count 197; 60 segs 27 lymphs 10 monos 2 eos 1 basophil SPEP IEP demonstrated 0.2 g/dL of IgA kappa monoclonal protein.  Serum free kappa 92.3 lambda 34.8 with a kappa lambda 2.65 IgG 909 IgA 477 IgM 41 CEA 3.2 Chemistries notable for creatinine 1.9  August 07 2022:  Scheduled follow up for MGUS, anemia and VTE. Patient attributes first VTE to use of testosterone and the second VTE to the use of a gel he was using for severe pain in his knees.  He is currently on Xarelto 20 mg daily.    Currently has severe dental problems and needs extractions.    Social:  Hauls junk and mows yards.  Quit smoking 30 yrs ago.  EtOH has not drank anything in years but only rarely before that  [facility] Mother died 78 CVA Father died 74 colon cancer Brothers x 5- one brother had cancer in jaw, eldest brother had blood clots Sisters x 2 - no history of cancer Maternal Grandmother died of endometrial cancer  WBC 5.9 hemoglobin 13.1 MCV 80.6 platelet count 192; 65 segs 25 lymphs 6  monos 3 eos 1 basophil Coombs test negative haptoglobin 158 Hemoglobin electrophoresis test hemoglobin A 59% A2 3.4%  Hgb C 37.6% consistent with hemoglobin C trait Anticardiolipin antibody negative.  Beta-2 glycol protein antibody negative.   ANA panel and rheumatoid factor negative D-dimer 2.91 INR 1.2 PTT 32 Testosterone 389 Factor V Leiden and prothrombin gene mutation testing negative.   August 21 2022:  Developed pruritus on Allopurinol and was placed on Febuxostat but can not afford it.   Not currently having a gouty attack.  May wish to consider using Probenecid for gout.  To be seen by dentist in about 2 weeks for consideration of extraction but is limited in extent of work which can be performed due to finances.    November 22 2022:  Scheduled follow up for MGUS and anemia.  Was changed to new medications for gout but did not tolerate it due to a flare which began immediately following it therefore he stopped it.  Has lost 11 lbs since July-- taking diuretics regularly.  He is also being careful of how much and what he eats.  Remains on Xarelto 20 mg daily without bleeding problems   Review of Systems  Constitutional:  Negative for appetite change, chills, fatigue and fever.       Lost weight after change in antihypertensive by nephrology  HENT:   Negative for mouth sores, nosebleeds, sore throat, trouble swallowing and voice change.   Eyes:  Negative for eye problems and icterus.       Vision changes:  None  Respiratory:  Negative for chest tightness, cough, hemoptysis and wheezing.        DOE on trying to do things in a hurry  Cardiovascular:  Negative for chest pain, leg swelling and palpitations.  Gastrointestinal:  Negative for abdominal pain, blood in stool, constipation, diarrhea, nausea and vomiting.  Endocrine: Negative for hot flashes.       Cold intolerance:  none Heat intolerance:  none  Genitourinary:  Positive for frequency. Negative for bladder incontinence,  difficulty urinating, dysuria, hematuria and nocturia.   Musculoskeletal:  Negative for arthralgias, back pain, gait problem, myalgias, neck pain and neck stiffness.  Skin:  Negative for rash and wound.       Sometimes had pruritus behind scrotum  Neurological:  Negative for dizziness, extremity weakness, gait problem, headaches, light-headedness, numbness and speech difficulty.  Hematological:  Negative for adenopathy. Does not bruise/bleed easily.  Psychiatric/Behavioral:  Negative for sleep disturbance and suicidal ideas. The patient is not nervous/anxious.     MEDICAL HISTORY: Past Medical History:  Diagnosis Date   Allergy    sinus problems   Anemia    Arthritis    Blood transfusion without reported diagnosis 2005   Cancer [facility]) 2005   Small bowel Cancer   Clotting disorder (HCC) 2016   Depression    Diabetes mellitus without complication (HCC)    controlled by diet.   Diverticula, colon    Fatigue    Gout    history of gout   Hyperlipidemia    Hypertension    Memory loss  Muscle pain    and cramping   Osteoporosis    pt denies hx of this.   Sleep apnea    is not on a Cpap    SURGICAL HISTORY: Past Surgical History:  Procedure Laterality Date   bowel removal     CARPAL TUNNEL WITH CUBITAL TUNNEL     COLONOSCOPY  2016   left arm surgery     LEG SURGERY     NOSE SURGERY     SMALL INTESTINE SURGERY     cancer removed and resectioned    SOCIAL HISTORY: Social History   Socioeconomic History   Marital status: Legally Separated    Spouse name: Not on file   Number of children: Not on file   Years of education: Not on file   Highest education level: Not on file  Occupational History   Not on file  Tobacco Use   Smoking status: Former   Smokeless tobacco: Never  Vaping Use   Vaping status: Never Used  Substance and Sexual Activity   Alcohol use: No   Drug use: No   Sexual activity: Not on file  Other Topics Concern   Not on file  Social History  Narrative   Not on file   Social Determinants of Health   Financial Resource Strain: Not on file  Food Insecurity: Not on file  Transportation Needs: Not on file  Physical Activity: Not on file  Stress: Not on file  Social Connections: Not on file  Intimate Partner Violence: Not on file    FAMILY HISTORY Family History  Problem Relation Age of Onset   Stroke Mother    Arthritis Mother    Diabetes Mother    Hypertension Mother    Cancer Father    Stroke Father    Hypertension Father    Gout Father    Colon cancer Father 39   Esophageal cancer Neg Hx    Rectal cancer Neg Hx    Stomach cancer Neg Hx     ALLERGIES:  has No Known Allergies.  MEDICATIONS:  Current Outpatient Medications  Medication Sig Dispense Refill   allopurinol (ZYLOPRIM) 300 MG tablet Take 1 tablet by mouth daily.     amLODipine (NORVASC) 5 MG tablet Take 5 mg by mouth every morning. (Patient not taking: Reported on 08/21/2022)     atorvastatin (LIPITOR) 80 MG tablet Take by mouth.     Cholecalciferol 50 MCG (2000 UT) TABS Take by mouth.     Febuxostat 80 MG TABS Take 1 tablet by mouth daily. (Patient not taking: Reported on 08/21/2022)     hydrocortisone (ANUSOL-HC) 2.5 % rectal cream Place 1 application rectally 2 (two) times daily. Hydrocortisone 2.5% cream rectal area twice daily after bowel movements for 10 days then as needed (Patient not taking: Reported on 08/21/2022) 30 g 0   losartan (COZAAR) 50 MG tablet Take by mouth.     Omega-3 1000 MG CAPS Take by mouth.     rivaroxaban (XARELTO) 20 MG TABS tablet Take 20 mg by mouth daily with supper.     torsemide (DEMADEX) 20 MG tablet Take 1 tablet by mouth daily. Taking 1/2 tab twice daily     No current facility-administered medications for this visit.    PHYSICAL EXAMINATION:  ECOG PERFORMANCE STATUS: 1 - Symptomatic but completely ambulatory   There were no vitals filed for this visit.   There were no vitals filed for this  visit.    Physical Exam Vitals and  nursing note reviewed.  Constitutional:      General: He is not in acute distress.    Appearance: Normal appearance. He is not ill-appearing or diaphoretic.     Comments: Here alone.  Comfortable  HENT:     Head: Normocephalic and atraumatic.     Right Ear: External ear normal.     Left Ear: External ear normal.     Nose: Nose normal.  Eyes:     General: No scleral icterus.    Conjunctiva/sclera: Conjunctivae normal.     Pupils: Pupils are equal, round, and reactive to light.  Cardiovascular:     Rate and Rhythm: Normal rate and regular rhythm.     Heart sounds:     No friction rub. No gallop.  Pulmonary:     Effort: Pulmonary effort is normal.     Breath sounds: Normal breath sounds. No wheezing, rhonchi or rales.  Abdominal:     Palpations: Abdomen is soft.     Tenderness: There is no abdominal tenderness. There is no guarding or rebound.  Musculoskeletal:        General: No deformity or signs of injury.     Cervical back: Normal range of motion and neck supple. No rigidity or tenderness.     Right lower leg: No edema.     Left lower leg: No edema.     Comments: Decreased ROM in knees due to arthritis  Lymphadenopathy:     Head:     Right side of head: No submental, submandibular, tonsillar, preauricular, posterior auricular or occipital adenopathy.     Left side of head: No submental, submandibular, tonsillar, preauricular, posterior auricular or occipital adenopathy.     Cervical: No cervical adenopathy.     Right cervical: No superficial, deep or posterior cervical adenopathy.    Left cervical: No superficial, deep or posterior cervical adenopathy.     Upper Body:     Right upper body: No supraclavicular or axillary adenopathy.     Left upper body: No supraclavicular or axillary adenopathy.     Lower Body: No right inguinal adenopathy. No left inguinal adenopathy.  Skin:    General: Skin is warm and dry.     Coloration: Skin is  not jaundiced or pale.     Findings: No bruising.  Neurological:     General: No focal deficit present.     Mental Status: He is alert and oriented to person, place, and time.     Gait: Gait abnormal.     Comments: Gait abnormal owing to arthritis in knees  Psychiatric:        Mood and Affect: Mood normal.        Behavior: Behavior normal.        Thought Content: Thought content normal.        Judgment: Judgment normal.      LABORATORY DATA: I have personally reviewed the data as listed:  No visits with results within 1 Month(s) from this visit.  Latest known visit with results is:  Clinical Support on 08/07/2022  Component Date Value Ref Range Status   Testosterone 08/07/2022 389  264 - 916 ng/dL Final   Comment: (NOTE) Adult male reference interval is based on a population of healthy nonobese males (BMI <30) between 70 and 47 years old. Travison, et.al. JCEM [phone]. PMID: 21308657. Performed At: [facility] [address] [place], [place] [id number] Jolene Schimke MD WU:[id number]    Rheumatoid fact SerPl-aCnc 08/07/2022 <10.0  <14.0 IU/mL  Final   Comment: (NOTE) Performed At: [facility] [address] [place], [place] [id number] Jolene Schimke MD WU:[id number]    ds DNA Ab 08/07/2022 <1  0 - 9 IU/mL Final   Comment: (NOTE)                                   Negative      <5                                   Equivocal  5 - 9                                   Positive      >9    Ribonucleic Protein 08/07/2022 0.2  0.0 - 0.9 AI Final   ENA SM Ab Ser-aCnc 08/07/2022 <0.2  0.0 - 0.9 AI Final   Scleroderma (Scl-70) (ENA) Antibod* 08/07/2022 <0.2  0.0 - 0.9 AI Final   SSA (Ro) (ENA) Antibody, IgG 08/07/2022 <0.2  0.0 - 0.9 AI Final   SSB (La) (ENA) Antibody, IgG 08/07/2022 <0.2  0.0 - 0.9 AI Final   Chromatin Ab SerPl-aCnc 08/07/2022 <0.2  0.0 - 0.9 AI Final   Anti JO-1 08/07/2022 <0.2  0.0 - 0.9 AI Final   Centromere Ab Screen 08/07/2022  <0.2  0.0 - 0.9 AI Final   See below: 08/07/2022 Comment   Final   Comment: (NOTE) Autoantibody                       Disease Association ------------------------------------------------------------                        Condition                  Frequency ---------------------   ------------------------   --------- Antinuclear Antibody,    SLE, mixed connective Direct (ANA-D)           tissue diseases ---------------------   ------------------------   --------- dsDNA                    SLE                        40 - 60% ---------------------   ------------------------   --------- Chromatin                Drug induced SLE                90%                         SLE                        48 - 97% ---------------------   ------------------------   --------- SSA (Ro)                 SLE                        25 - 35%  Sjogren's Syndrome         40 - 70%                         Neonatal Lupus                 100% ---------------------   ------------------------   --------- SSB (La)                 SLE                                                       10%                         Sjogren's Syndrome              30% ---------------------   -----------------------    --------- Sm (anti-Smith)          SLE                        15 - 30% ---------------------   -----------------------    --------- RNP                      Mixed Connective Tissue                         Disease                         95% (U1 nRNP,                SLE                        30 - 50% anti-ribonucleoprotein)  Polymyositis and/or                         Dermatomyositis                 20% ---------------------   ------------------------   --------- Scl-70 (antiDNA          Scleroderma (diffuse)      20 - 35% topoisomerase)           Crest                           13% ---------------------   ------------------------   --------- Jo-1                     Polymyositis and/or                          Dermatomyositis            20 - 40% ---------------------   ------------------------   --------- Centromere B             Scleroderma -                           Crest  variant                         80% Performed At: [facility] [address] [place], [place] [id number] Jolene Schimke MD WU:[id number]    Recommendations-F5LEID: 08/07/2022 Comment   Final   Comment: (NOTE) Result: c.1601G>A (p.Arg534Gln) - Not Detected This result is not associated with an increased risk for venous thromboembolism. See Additional Clinical Information and Comments. Additional Clinical Information: Venous thromboembolism is a multifactorial disease influenced by genetic, environmental, and circumstantial risk factors. The c.1601G>A (p. Arg534Gln) variant in the F5 gene, commonly referred to as Factor V Leiden, is a genetic risk factor for venous thromboembolism. Heterozygous carriers of this variant have a 6- to 8- fold increased risk for venous thromboembolism. Individuals homozygous for this variant (ie, with a copy of the variant on each chromosome) have an approximately 80-fold increased risk for venous thromboembolism. Individuals who carry both a c.*97G>A variant in the F2 gene and Factor V Leiden have an approximately 20-fold increased risk for venous thromboembolism. Risks are likely to be even higher in more complex genotype combinations in                          volving the F2 c.*97G>A variant and Factor V Leiden (PMID: 25366440). Additional risk factors include but are not limited to: deficiency of protein C, protein S, or antithrombin III, age, male sex, personal or family history of deep vein thromboembolism, smoking, surgery, prolonged immobilization, malignant neoplasm, tamoxifen treatment, raloxifene treatment, oral contraceptive use, hormone replacement therapy, and pregnancy. Management of thrombotic risk and thrombotic events  should follow established guidelines and fit the clinical circumstance. This result cannot predict the occurrence or recurrence of a thrombotic event. Comment: Genetic counseling is recommended to discuss the potential clinical implications of positive results, as well as recommendations for testing family members. Genetic Coordinators are available for health care providers to discuss results at [phone]). Test Details: Variant Analyzed: c.1601G>A (p. Arg534Gln), referred to as Fact                          or V Leiden Methods/Limitations: DNA analysis of the F5 gene (NM_000130.5) was performed by PCR amplification followed by restriction enzyme analysis. The diagnostic sensitivity is >99%. Results must be combined with clinical information for the most accurate interpretation. Molecular- based testing is highly accurate, but as in any laboratory test, diagnostic errors may occur. False positive or false negative results may occur for reasons that include genetic variants, blood transfusions, bone marrow transplantation, somatic or tissue-specific mosaicism, mislabeled samples, or erroneous representation of family relationships. This test was developed and its performance characteristics determined by [facility]. It has not been cleared or approved by the Food and Drug Administration. References: Dewitt Hoes [facility], Valentina Lucks [facility]; [facility] Professional Practice and Guidelines Committee. Addendum: [employer] of Medical Genetics consensus statement on fac                          tor V Leiden mutation testing. Genet Med. 2021 Mar 5. doi: 25.9563/O75643-329- 01108-x. PMID: 51884166. Cherrie Gauze. Factor V Leiden Thrombophilia. 1999 May 14 (Updated 2018 Jan 4). In: Bufford Buttner, Ardinger HH, Pagon RA, et al., editors. GeneReviews(R) (Internet). [address] ([place]): [place] [employer] [place], [place]; 0630-1601. Available  from: [email or web address]-mcgee.org/ Nita Sickle, Dietrich Pates,  Maryann Conners CS; [facility] Laboratory Quality Assurance Committee. Venous thromboembolism laboratory testing (factor V Leiden and factor II c.*97G>A), 2018 update: a technical standard of the [employer] of [employer] and Genomics ([facility]). Genet Med. 2018 Dec;20(12):1489-1498. doi: 10.1038/s41436-815-514-8198-z. Epub 2018 Oct 5. PMID: 16109604.    Reviewed By: 08/07/2022 Comment   Final   Comment: (NOTE) Technical Component performed at [employer] [facility] Professional Component performed by: [facility] [employer] Alpha Gula, Ph.D., [facility] Director, Molecular Genetics [address] [place] [address] Performed At: [facility] [address] [place], [place] [id number] Maurine Simmering MDPhD FA:[phone]    Recommendations-PTGENE: 08/07/2022 Comment   Final   Comment: (NOTE) Result: c.*97G>A - Not Detected This result is not associated with an increased risk for venous thromboembolism. See Additional Clinical Information and Comments. Additional Clinical Information: Venous thromboembolism is a multifactorial disease influenced by genetic, environmental, and circumstantial risk factors. The c.*97G>A variant in the F2 gene is a genetic risk factor for venous thromboembolism. Heterozygous carriers have a 2- to 4-fold increased risk for venous thromboembolism. Homozygotes for the c.*97G>A variant are rare. The annual risk of VTE in homozygotes has been reported to be 1.1%/year. Individuals who carry both a c.*97G>A variant in the F2 gene and a c.1601G>A (p. Arg534Gln) variant in the F5 gene (commonly referred to as Factor V Leiden) have an approximately 20- fold increased risk for venous thromboembolism. Risks are likely to be even higher in more complex genotype combinations involving the F2 c.*97G>A variant and Factor V Leiden (PMID:                            69629528). Additional risk factors include but are not limited to: deficiency of protein C, protein S, or antithrombin III, age, male sex, personal or family history of deep vein thromboembolism, smoking, surgery, prolonged immobilization, malignant neoplasm, tamoxifen treatment, raloxifene treatment, oral contraceptive use, hormone replacement therapy, and pregnancy. Management of thrombotic risk and thrombotic events should follow established guidelines and fit the clinical circumstance. This result cannot predict the occurrence or recurrence of a thrombotic event. Comments: Genetic counseling is recommended to discuss the potential clinical implications of positive results, as well as recommendations for testing family members. Genetic Coordinators are available for health care providers to discuss results at [phone]). Test Details: Variant analyzed: c.*97G>A, previously referred to as G20210A Methods/Limitations: DNA analysis of the F2 gene (NM_000                          506.5) was performed by PCR amplification followed by restriction enzyme analysis. The diagnostic sensitivity is >99%. Results must be combined with clinical information for the most accurate interpretation. Molecular-based testing is highly accurate, but as in any laboratory test, diagnostic errors may occur. False positive or false negative results may occur for reasons that include genetic variants, blood transfusions, bone marrow transplantation, somatic or tissue-specific mosaicism, mislabeled samples, or erroneous representation of family relationships. This test was developed and its performance characteristics determined by [facility]. It has not been cleared or approved by the Food and Drug Administration. References: Dewitt Hoes [facility], Valentina Lucks [facility]; [facility] Professional Practice and Guidelines Committee. Addendum: [employer] of Medical Genetics consensus statement  on factor V Leiden mutation testing. Genet Med. 2021 Mar 5. doi: 44.0102/V253  [phone]. PMID: 53664403. Cherrie Gauze. Prothrombin Thrombophilia. 2006 Jul 25 [Updated 2021 Feb 4]. In: Bufford Buttner, Ardinger HH, Pagon RA, et al., editors. GeneReviews(R) [Internet]. [address] ([place]): [place], [place]; 4742-5956. Available from: [email or web address].[email or web address]/ Nita Sickle, Blanca Friend, Alvie Heidelberg CS; [facility] Laboratory Quality Assurance Committee. Venous thromboembolism laboratory testing (factor V Leiden and factor II c.*97G>A), 2018 update: a technical standard of the [employer] and Genomics ([facility]). Genet Med. 2018 Dec;20(12):1489-1498. doi: 10.1038/s41436-(217) 450-4131-z. Epub 2018 Oct 5. PMID: 38756433.    Reviewed by: 08/07/2022 Comment   Final   Comment: (NOTE) Technical Component performed at [employer] [facility] Professional Component performed by: [employer], Ph.D., [facility] Director, Molecular Genetics [phone] Championship [address] [place] [address] Performed At: [facility] [address] [place], [place] [id number] Maurine Simmering MDPhD XN:[id number]    Beta-2 Glyco I IgG 08/07/2022 <9  0 - 20 GPI IgG units Final   Comment: (NOTE) The reference interval reflects a 3SD or 99th percentile interval, which is thought to represent a potentially clinically significant result in accordance with the International Consensus Statement on the classification criteria for definitive antiphospholipid syndrome (APS). J Thromb Haem 2006;4:295-306.    Beta-2-Glycoprotein I IgM 08/07/2022 <9  0 - 32 GPI IgM units Final   Comment: (NOTE) The reference interval reflects a 3SD or 99th percentile interval, which is thought to represent a potentially clinically significant result in accordance with the International Consensus Statement on the classification  criteria for definitive antiphospholipid syndrome (APS). J Thromb Haem 2006;4:295-306. Performed At: [facility] [address] [place], [place] [id number] Jolene Schimke MD SE:[id number]    Beta-2-Glycoprotein I IgA 08/07/2022 <9  0 - 25 GPI IgA units Final   Comment: (NOTE) The reference interval reflects a 3SD or 99th percentile interval, which is thought to represent a potentially clinically significant result in accordance with the International Consensus Statement on the classification criteria for definitive antiphospholipid syndrome (APS). J Thromb Haem 2006;4:295-306.    Anticardiolipin IgG 08/07/2022 <9  0 - 14 GPL U/mL Final   Comment: (NOTE)                          Negative:              <15                          Indeterminate:     15 - 20                          Low-Med Positive: >20 - 80                          High Positive:         >80    Anticardiolipin IgM 08/07/2022 <9  0 - 12 MPL U/mL Final   Comment: (NOTE)                          Negative:              <13                          Indeterminate:     13 - 20  Low-Med Positive: >20 - 80                          High Positive:         >80 Performed At: [facility] [address] [place], [place] [id number] Jolene Schimke MD WU:[id number]    D-Dimer, Quant 08/07/2022 2.91 (H)  0.00 - 0.50 ug/mL-FEU Final   Comment: (NOTE) At the manufacturer cut-off value of 0.5 g/mL FEU, this assay has a negative predictive value of 95-100%.This assay is intended for use in conjunction with a clinical pretest probability (PTP) assessment model to exclude pulmonary embolism (PE) and deep venous thrombosis (DVT) in outpatients suspected of PE or DVT. Results should be correlated with clinical presentation. Performed at [facility], [address], [place], [place] [address]    Prothrombin Time 08/07/2022 15.8 (H)  11.4 - 15.2 seconds Final   INR  08/07/2022 1.2  0.8 - 1.2 Final   Comment: (NOTE) INR goal varies based on device and disease states. Performed at [facility], [address] [place], [place] [address]    aPTT 08/07/2022 32  24 - 36 seconds Final   Performed at [facility], [address], [place], [place] [address]   Hgb F 08/07/2022 Reflexed  0.0 - 2.0 % Final   Hgb A 08/07/2022 Reflexed  96.4 - 98.8 % Final   Hgb A2 08/07/2022 Reflexed  1.8 - 3.2 % Final   Hgb S 08/07/2022 Reflexed  0.0 % Final   Interpretation, Hgb Fract 08/07/2022 Comment   Final   Comment: (NOTE) Capillary Electrophoresis (CE) performed. Reflex to High-pressure liquid chromatography (HPLC) method for confirmation. Performed At: [facility] [address] [place], [place] [id number] Jolene Schimke MD KG:[id number]    Haptoglobin 08/07/2022 165  32 - 363 mg/dL Final   Comment: (NOTE) Performed At: [facility] [address] [place], [place] [id number] Jolene Schimke MD VZ:[id number]    Retic Ct Pct 08/07/2022 2.1  0.4 - 3.1 % Final   RBC. 08/07/2022 4.64  4.22 - 5.81 MIL/uL Final   Retic Count, Absolute 08/07/2022 98.4  19.0 - 186.0 K/uL Final   Immature Retic Fract 08/07/2022 29.3 (H)  2.3 - 15.9 % Final   Performed at [facility], [address], [place], [place] [address]   Vitamin B-12 08/07/2022 244  180 - 914 pg/mL Final   Comment: (NOTE) This assay is not validated for testing neonatal or myeloproliferative syndrome specimens for Vitamin B12 levels. Performed at [facility], [address], [place], [place] [address]    Copper 08/07/2022 90  69 - 132 ug/dL Final   Comment: (NOTE) This test was developed and its performance characteristics determined by [facility]. It has not been cleared or approved by the Food and Drug Administration.                                Detection Limit = 5 Performed At: [facility] [address] [place], [place] [id number] Jolene Schimke MD XN:[id number]    DAT, complement 08/07/2022 NEG   Final   DAT, IgG 08/07/2022    Final                   Value:NEG Performed at [facility], [address] [place], [place] [address]    Folate 08/07/2022  20.1  >5.9 ng/mL Final   Performed at [facility], [address], [place], [place] [address]   Ferritin 08/07/2022 189  24 - 336 ng/mL Final   Performed at [facility], [address] [place], [place] [address]   WBC Count 08/07/2022 5.9  4.0 - 10.5 K/uL Final   RBC 08/07/2022 4.64  4.22 - 5.81 MIL/uL Final   Hemoglobin 08/07/2022 13.1  13.0 - 17.0 g/dL Final   HCT 95/28/4132 37.4 (L)  39.0 - 52.0 % Final   MCV 08/07/2022 80.6  80.0 - 100.0 fL Final   MCH 08/07/2022 28.2  26.0 - 34.0 pg Final   MCHC 08/07/2022 35.0  30.0 - 36.0 g/dL Final   RDW 44/01/270 16.0 (H)  11.5 - 15.5 % Final   Platelet Count 08/07/2022 192  150 - 400 K/uL Final   nRBC 08/07/2022 0.0  0.0 - 0.2 % Final   Neutrophils Relative % 08/07/2022 65  % Final   Neutro Abs 08/07/2022 3.8  1.7 - 7.7 K/uL Final   Lymphocytes Relative 08/07/2022 25  % Final   Lymphs Abs 08/07/2022 1.4  0.7 - 4.0 K/uL Final   Monocytes Relative 08/07/2022 6  % Final   Monocytes Absolute 08/07/2022 0.4  0.1 - 1.0 K/uL Final   Eosinophils Relative 08/07/2022 3  % Final   Eosinophils Absolute 08/07/2022 0.2  0.0 - 0.5 K/uL Final   Basophils Relative 08/07/2022 1  % Final   Basophils Absolute 08/07/2022 0.1  0.0 - 0.1 K/uL Final   Immature Granulocytes 08/07/2022 0  % Final   Abs Immature Granulocytes 08/07/2022 0.01  0.00 - 0.07 K/uL Final   Performed at [facility], [address], [place], [place] [address]   Hgb F 08/07/2022 0.0  0.0 - 2.0 % Final   Hgb A 08/07/2022 59.0 (L)  96.4 - 98.8 % Final   Hgb A2 08/07/2022 3.4 (H)  1.8 - 3.2 % Final   Hgb S 08/07/2022 0.0  0.0 % Final   Hgb C 08/07/2022 37.6  (H)  0.0 % Final   Hgb E 08/07/2022 0.0  0.0 % Final   Hgb Variant 08/07/2022 0.0  0.0 % Final   Final Interpretation: 08/07/2022 Comment   Final   Comment: (NOTE) Hemoglobin pattern and concentration are consistent with Hgb C trait (heterozygous). Suggest clinical and hematologic correlation.            Hgb C-Trait Interpretation Ranges            Hgb A      50.0 - 70.0%            Hgb C      30.0 - 45.0%            Hgb A2      2.0 -  4.5% Performed At: [facility] [address] [place], [place] [id number] Jolene Schimke MD DG:[id number]     RADIOGRAPHIC STUDIES: I have personally reviewed the radiological images as listed and agree with the findings in the report  No results found.  ASSESSMENT/PLAN  67 y.o. male is here because of MGUS, anemia, CKD history of small bowel cancer, colon polyps and recurrent VTE.      MGUS, IgA kappa- Diagnosed in June 2016  July 24 2019: SPEP with IEP demonstrated 0.2 g/dL of IgA kappa monoclonal protein.  Serum free kappa 108.2 lambda 36.1 with a kappa lambda 3.0 Ig G 1061 IgA 586  IgM 45 Beta 2 microglobulin 2.5 LDH 183 CMP notable for creatinine 1.8 WBC 7.8 globin 15.0 MCV 77 platelet count 213; 63 segs 26 lymphs monos 1 EO 1 basophil    July 31, 2019: 24-hour urine loss of 5298 mg of protein per day.  UPEP with IEP negative   August 07, 2019: Left posterior iliac crest bone marrow biopsy-essentially normocellular marrow for age (40 to 50%) orderly maturation.  Plasma cells constitute 5% of the marrow cellularity with a small percentage have kappa predominance   July 19, 2022: SPEP IEP 0.2 g/dL of IgA kappa monoclonal protein.  Serum free kappa 92.3 lambda 34.8 with a kappa lambda 2.65 IgG 909 IgA 477 IgM 41   August 07 2022- MGUS appears to be stable.  No clinical evidence to suggest myeloma.  Can consider 24 hr urine to evaluate for monoclonal protein.  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  Which is relatively stable for him.  Continue to follow.  Ferritin pending  Hemoglobin C:  Is caused by a mutation in the beta-globin chain in which glutamate is replaced by lysine  in the sixth position of the beta-globin chain. This mutation makes Hb C less soluble than Hb A, forming hexagonal crystals (HbC crystals as seen in the peripheral smear).     The mutation present in some of the hemoglobinopathies (Hb S, Hb C, Hb E) may be an evolutionary modification due to the effect of some selective external forces like malaria as in the heterozygous form, these mutations protect carriers from dying of malarial infection. Hemoglobin C trait (Hgb AC):  Generally asymptomatic (phenotypically normal).  Hgb concentrations are usually within the low normal to the normal range. Life span of RBC's are decreased but reticulocyte counts are not increased. Peripheral smear may show target cells and intracellular crystals.   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  Treated with Lovenox, placement of IVC filter (later removed), and Coumadin.  Attributed to testosterone replacement  November 13 2021-  Large volume bilateral pulmonary emboli with right heart strain (submassive PE).  Treated with heparin gtt.  Discharged on Eliquis.  November 23 2022- Remains on Xarelto 20 mg daily.  Will check CMP and d dimer.  May be reasonable to consider tapering dose to prophylactic level   Hypercoagulable state evaluation August 07 2002-  Anticardiolipin antibody negative.  Beta-2 glycol protein antibody negative.   ANA panel and rheumatoid factor negative D-dimer 2.91 INR 1.2 PTT 32 Testosterone  389 Factor V Leiden and prothrombin gene mutation testing negative.  Because of anticoagulation with a direct thrombin inhibitor can not obtain Protein C, Protein S, ATIII activities or Lupus Anticoagulant as those are interfered with by these agents.    Can consider PCR for bcr-abl, JAK 2 with reflex, flow for PNH, FVIII level.    Gout  August 21 2022- Developed pruritus on Allopurinol and was placed on Febuxostat but can not afford it. Not currently having a gouty attack. May wish to consider using Probenecid  November 22 2022- Flared in setting of starting a new treatment.  No new  flares.  Discussed possible triggers.   Adenocarcinoma of small bowel, Stage II (T3 N0 M0)   September 2004- Treated with resection followed by adjuvant chemotherapy  July 19 2022- CEA 3.2   Cancer Staging  Small bowel cancer [facility]) Staging form: Small Intestine, AJCC 6th Edition - Clinical stage from 09/13/2002: Stage II (T3, N0, M0) - Signed by Dellia Beckwith, MD on 03/04/2021 Staged by: Managing physician Diagnostic confirmation: Positive histology Specimen type: Excision Histopathologic type: Adenocarcinoma, intestinal type Total positive nodes: 0 Stage prefix: Initial diagnosis Lymphatic vessel invasion (L): L0 - No lymphatic vessel invasion Venous invasion (V): V0 - No venous invasion Prognostic indicators: Treated with surgery and adjuvant chemotherapy, incl doxorubicin    No problem-specific Assessment & Plan notes found for this encounter.    No orders of the defined types were placed in this encounter.   30  minutes was spent in patient care.  This included time spent preparing to see the patient (e.g., review of tests), obtaining and/or reviewing separately obtained history, counseling and educating the patient/family/caregiver, ordering medications, tests, or procedures; documenting clinical information in the electronic or other health record, independently interpreting results and  communicating results to the patient/family/caregiver as well as coordination of care.       All questions were answered. The patient knows to call the clinic with any problems, questions or concerns.  This note was electronically signed.    Loni Muse, MD  11/22/2022 9:00 AM

## 2022-12-12 DIAGNOSIS — R059 Cough, unspecified: Secondary | ICD-10-CM | POA: Diagnosis not present

## 2022-12-12 DIAGNOSIS — R0981 Nasal congestion: Secondary | ICD-10-CM | POA: Diagnosis not present

## 2023-01-15 DIAGNOSIS — R0981 Nasal congestion: Secondary | ICD-10-CM | POA: Diagnosis not present

## 2023-01-15 DIAGNOSIS — R051 Acute cough: Secondary | ICD-10-CM | POA: Diagnosis not present

## 2023-01-19 ENCOUNTER — Other Ambulatory Visit: Payer: PPO

## 2023-01-19 ENCOUNTER — Ambulatory Visit: Payer: PPO | Admitting: Hematology and Oncology

## 2023-02-27 DIAGNOSIS — I129 Hypertensive chronic kidney disease with stage 1 through stage 4 chronic kidney disease, or unspecified chronic kidney disease: Secondary | ICD-10-CM | POA: Diagnosis not present

## 2023-02-27 DIAGNOSIS — N183 Chronic kidney disease, stage 3 unspecified: Secondary | ICD-10-CM | POA: Diagnosis not present

## 2023-03-06 DIAGNOSIS — N183 Chronic kidney disease, stage 3 unspecified: Secondary | ICD-10-CM | POA: Diagnosis not present

## 2023-03-06 DIAGNOSIS — M7989 Other specified soft tissue disorders: Secondary | ICD-10-CM | POA: Diagnosis not present

## 2023-03-06 DIAGNOSIS — R801 Persistent proteinuria, unspecified: Secondary | ICD-10-CM | POA: Diagnosis not present

## 2023-03-06 DIAGNOSIS — M898X9 Other specified disorders of bone, unspecified site: Secondary | ICD-10-CM | POA: Diagnosis not present

## 2023-03-06 DIAGNOSIS — I129 Hypertensive chronic kidney disease with stage 1 through stage 4 chronic kidney disease, or unspecified chronic kidney disease: Secondary | ICD-10-CM | POA: Diagnosis not present

## 2023-03-06 DIAGNOSIS — E559 Vitamin D deficiency, unspecified: Secondary | ICD-10-CM | POA: Diagnosis not present

## 2023-03-06 DIAGNOSIS — D472 Monoclonal gammopathy: Secondary | ICD-10-CM | POA: Diagnosis not present

## 2023-03-06 DIAGNOSIS — E790 Hyperuricemia without signs of inflammatory arthritis and tophaceous disease: Secondary | ICD-10-CM | POA: Diagnosis not present

## 2023-03-20 ENCOUNTER — Other Ambulatory Visit: Payer: Self-pay | Admitting: Hematology and Oncology

## 2023-03-20 DIAGNOSIS — D472 Monoclonal gammopathy: Secondary | ICD-10-CM

## 2023-03-20 DIAGNOSIS — Z8500 Personal history of malignant neoplasm of unspecified digestive organ: Secondary | ICD-10-CM

## 2023-03-21 ENCOUNTER — Other Ambulatory Visit: Payer: Self-pay | Admitting: Hematology and Oncology

## 2023-03-21 DIAGNOSIS — I2692 Saddle embolus of pulmonary artery without acute cor pulmonale: Secondary | ICD-10-CM

## 2023-03-22 ENCOUNTER — Encounter: Payer: Self-pay | Admitting: Hematology and Oncology

## 2023-03-22 ENCOUNTER — Ambulatory Visit: Payer: PPO | Admitting: Oncology

## 2023-03-22 ENCOUNTER — Inpatient Hospital Stay: Payer: PPO | Attending: Hematology and Oncology

## 2023-03-22 ENCOUNTER — Inpatient Hospital Stay: Payer: PPO | Admitting: Hematology and Oncology

## 2023-03-22 ENCOUNTER — Telehealth: Payer: Self-pay | Admitting: Hematology and Oncology

## 2023-03-22 VITALS — BP 134/96 | HR 75 | Temp 97.5°F | Resp 20 | Ht 74.0 in | Wt 292.4 lb

## 2023-03-22 DIAGNOSIS — I2699 Other pulmonary embolism without acute cor pulmonale: Secondary | ICD-10-CM | POA: Diagnosis not present

## 2023-03-22 DIAGNOSIS — I10 Essential (primary) hypertension: Secondary | ICD-10-CM | POA: Insufficient documentation

## 2023-03-22 DIAGNOSIS — Z800 Family history of malignant neoplasm of digestive organs: Secondary | ICD-10-CM | POA: Diagnosis not present

## 2023-03-22 DIAGNOSIS — Z85068 Personal history of other malignant neoplasm of small intestine: Secondary | ICD-10-CM | POA: Diagnosis not present

## 2023-03-22 DIAGNOSIS — Z7901 Long term (current) use of anticoagulants: Secondary | ICD-10-CM | POA: Diagnosis not present

## 2023-03-22 DIAGNOSIS — E1165 Type 2 diabetes mellitus with hyperglycemia: Secondary | ICD-10-CM | POA: Insufficient documentation

## 2023-03-22 DIAGNOSIS — Z87891 Personal history of nicotine dependence: Secondary | ICD-10-CM | POA: Diagnosis not present

## 2023-03-22 DIAGNOSIS — Z79899 Other long term (current) drug therapy: Secondary | ICD-10-CM | POA: Diagnosis not present

## 2023-03-22 DIAGNOSIS — I2692 Saddle embolus of pulmonary artery without acute cor pulmonale: Secondary | ICD-10-CM | POA: Diagnosis not present

## 2023-03-22 DIAGNOSIS — Z86718 Personal history of other venous thrombosis and embolism: Secondary | ICD-10-CM | POA: Diagnosis not present

## 2023-03-22 DIAGNOSIS — M170 Bilateral primary osteoarthritis of knee: Secondary | ICD-10-CM | POA: Insufficient documentation

## 2023-03-22 DIAGNOSIS — G4730 Sleep apnea, unspecified: Secondary | ICD-10-CM | POA: Insufficient documentation

## 2023-03-22 DIAGNOSIS — Z85060 Personal history of malignant carcinoid tumor of small intestine: Secondary | ICD-10-CM

## 2023-03-22 DIAGNOSIS — Z9221 Personal history of antineoplastic chemotherapy: Secondary | ICD-10-CM | POA: Diagnosis not present

## 2023-03-22 DIAGNOSIS — D582 Other hemoglobinopathies: Secondary | ICD-10-CM | POA: Diagnosis not present

## 2023-03-22 DIAGNOSIS — D472 Monoclonal gammopathy: Secondary | ICD-10-CM

## 2023-03-22 DIAGNOSIS — E785 Hyperlipidemia, unspecified: Secondary | ICD-10-CM | POA: Diagnosis not present

## 2023-03-22 DIAGNOSIS — Z8500 Personal history of malignant neoplasm of unspecified digestive organ: Secondary | ICD-10-CM

## 2023-03-22 DIAGNOSIS — R718 Other abnormality of red blood cells: Secondary | ICD-10-CM | POA: Insufficient documentation

## 2023-03-22 DIAGNOSIS — Z86711 Personal history of pulmonary embolism: Secondary | ICD-10-CM | POA: Diagnosis not present

## 2023-03-22 HISTORY — DX: Personal history of malignant carcinoid tumor of small intestine: Z85.060

## 2023-03-22 HISTORY — DX: Other pulmonary embolism without acute cor pulmonale: I26.99

## 2023-03-22 HISTORY — DX: Other abnormality of red blood cells: R71.8

## 2023-03-22 LAB — CMP (CANCER CENTER ONLY)
ALT: 15 U/L (ref 0–44)
AST: 19 U/L (ref 15–41)
Albumin: 4 g/dL (ref 3.5–5.0)
Alkaline Phosphatase: 88 U/L (ref 38–126)
Anion gap: 11 (ref 5–15)
BUN: 51 mg/dL — ABNORMAL HIGH (ref 8–23)
CO2: 24 mmol/L (ref 22–32)
Calcium: 9.6 mg/dL (ref 8.9–10.3)
Chloride: 108 mmol/L (ref 98–111)
Creatinine: 2.09 mg/dL — ABNORMAL HIGH (ref 0.61–1.24)
GFR, Estimated: 34 mL/min — ABNORMAL LOW (ref >60)
Glucose, Bld: 103 mg/dL — ABNORMAL HIGH (ref 70–99)
Potassium: 4.6 mmol/L (ref 3.5–5.1)
Sodium: 142 mmol/L (ref 135–145)
Total Bilirubin: 0.7 mg/dL (ref 0.0–1.2)
Total Protein: 6.7 g/dL (ref 6.5–8.1)

## 2023-03-22 LAB — CBC WITH DIFFERENTIAL (CANCER CENTER ONLY)
Abs Immature Granulocytes: 0.01 10*3/uL (ref 0.00–0.07)
Basophils Absolute: 0.1 10*3/uL (ref 0.0–0.1)
Basophils Relative: 1 %
Eosinophils Absolute: 0.2 10*3/uL (ref 0.0–0.5)
Eosinophils Relative: 2 %
HCT: 38.3 % — ABNORMAL LOW (ref 39.0–52.0)
Hemoglobin: 13.7 g/dL (ref 13.0–17.0)
Immature Granulocytes: 0 %
Lymphocytes Relative: 20 %
Lymphs Abs: 1.4 10*3/uL (ref 0.7–4.0)
MCH: 28.2 pg (ref 26.0–34.0)
MCHC: 35.8 g/dL (ref 30.0–36.0)
MCV: 78.8 fL — ABNORMAL LOW (ref 80.0–100.0)
Monocytes Absolute: 0.5 10*3/uL (ref 0.1–1.0)
Monocytes Relative: 7 %
Neutro Abs: 4.8 10*3/uL (ref 1.7–7.7)
Neutrophils Relative %: 70 %
Platelet Count: 174 10*3/uL (ref 150–400)
RBC: 4.86 MIL/uL (ref 4.22–5.81)
RDW: 16.4 % — ABNORMAL HIGH (ref 11.5–15.5)
WBC Count: 6.8 10*3/uL (ref 4.0–10.5)
nRBC: 0 % (ref 0.0–0.2)
nRBC: 0 /100{WBCs}

## 2023-03-22 LAB — CEA (ACCESS): CEA (CHCC): 3.61 ng/mL (ref 0.00–5.00)

## 2023-03-22 NOTE — Assessment & Plan Note (Signed)
 He has had chronic microcytosis without anemia dating back to 2011.  Hemoglobin electrophoresis revealed hemoglobin C trait, which explains the microcytosis.

## 2023-03-22 NOTE — Assessment & Plan Note (Signed)
 Monoclonal gammopathy of uncertain significance, IgA kappa diagnosed in 2016.  This has not progressed.  Serum protein electrophoresis, quantitative immunoglobulins and serum light chains are pending from today.

## 2023-03-22 NOTE — Assessment & Plan Note (Signed)
 Recurrent pulmonary embolism in November 2023 with previous saddle pulmonary embolism in September 2014.  The patient presented with worsening shortness of breath.  At that time, he was found to have left lower extremity DVT, as well as bilateral pulmonary emboli with evidence of right heart strain.  He was admitted to [facility] and placed on heparin drip.  He was discharged on apixaban, which he continues.  There was no obvious provocation for this episode of DVT/PE.  He later saw Dr. Angelene Giovanni and hypercoagulable evaluation for factor V Leiden, prothrombin gene mutation, beta-2 glycoprotein, anticardiolipin antibodies were negative/normal. Due to the recurrent episode of severe pulmonary emboli, he recommended lifelong anticoagulation.

## 2023-03-22 NOTE — Assessment & Plan Note (Deleted)
 Remote history of stage III small bowel cancer.  He remains without evidence of recurrence.

## 2023-03-22 NOTE — Assessment & Plan Note (Signed)
 Remote history of stage III adenocarcinoma of the small bowel treated with surgical resection and adjuvant chemotherapy. He remains without evidence of recurrence.

## 2023-03-22 NOTE — Telephone Encounter (Signed)
 Patient has been scheduled for follow-up visit per 03/22/23 LOS.  Pt given an appt calendar with date and time.

## 2023-03-22 NOTE — Progress Notes (Signed)
 [name]  [address] [place],  [place]  [address] [phone]  Clinic Day:  03/22/2023  Referring physician: Paulina Fusi, MD  ASSESSMENT & PLAN:   Assessment & Plan: Recurrent pulmonary emboli [name]) Recurrent pulmonary embolism in November 2023 with previous saddle pulmonary embolism in September 2014.  The patient presented with worsening shortness of breath.  At that time, he was found to have left lower extremity DVT, as well as bilateral pulmonary emboli with evidence of right heart strain.  He was admitted to [facility] and placed on heparin drip.  He was discharged on apixaban, which he continues.  There was no obvious provocation for this episode of DVT/PE.  He later saw Dr. Angelene Giovanni and hypercoagulable evaluation for factor V Leiden, prothrombin gene mutation, beta-2 glycoprotein, anticardiolipin antibodies were negative/normal. Due to the recurrent episode of severe pulmonary emboli, he recommended lifelong anticoagulation.  Microcytosis He has had chronic microcytosis without anemia dating back to 2011.  Hemoglobin electrophoresis revealed hemoglobin C trait, which explains the microcytosis.  Monoclonal gammopathy Monoclonal gammopathy of uncertain significance, IgA kappa diagnosed in 2016.  This has not progressed.  Serum protein electrophoresis, quantitative immunoglobulins and serum light chains are pending from today.   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 He remains without evidence of recurrence.    The patient understands the plans discussed today and is in agreement with them.  He knows to contact our office if he develops concerns prior to his next appointment.   I provided 30 minutes of face-to-face time during this encounter and > 50% was spent counseling as documented under my assessment and  plan.    Adah Perl, PA-C  [facility] - [facility] [address] [place] [address] Dept: [phone] Dept Fax: [phone]   No orders of the defined types were placed in this encounter.     CHIEF COMPLAINT:  CC:  Recurrent pulmonary emboli  Current Treatment:  Apixaban 5 mg twice daily  HISTORY OF PRESENT ILLNESS:  Anthony Le is a 68 y.o. 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  This was treated with surgery and adjuvant chemotherapy.  He has never had evidence of recurrence.  We had been following him yearly.   The patient developed massive saddle pulmonary embolism in September 2014, at which time he was on testosterone.  He also had port-a-cath in place.  CTA chest at [facility] revealed significant bilateral pulmonary embolism with saddle component of clot and moderate-to-large overall pulmonary arterial thrombus burden. There was evidence of probable right heart strain.  Due to this, he was transferred [facility].  He underwent removal of his port-a-cath, as well as placement of IVC filter.  He was discharged on enoxaparin and transitioned to warfarin, which was monitored by Dr. Tomasa Blase.  Warfarin was subsequently discontinued due to hemorrhoidal bleeding. The IVC filter was subsequently removed in 2015.   He was referred back in June 2016 because of a small monoclonal spike on serum protein electrophoresis, felt to represent monoclonal gammopathy of uncertain significance, IgA kappa, originally noted in June 2016.  The monoclonal spike has ranged from 0.2 to 0.4 for 6 years.  The urine protein electrophoresis was negative for a monoclonal spike even though he had  3+ proteinuria.  He also has had increased free kappa and lambda light chains.  He also has had chronic microcytosis, without anemia, with normal iron studies.  We had been seeing  him every 6 months to follow the MGUS.  He underwent a bone marrow aspirate and biopsy back in July 2021, which was negative for multiple myeloma.  He had been referred by Dr. Hyman Hopes to Dr. Leonides Schanz, since neither of them realized that we were following the MGUS.  Most recent colonoscopy in August 2021 with Dr. Chales Abrahams was normal except for a 4 mm polyp of the ascending colon.  Surgical pathology revealed a tubular adenoma, and repeat in 5 years was recommended.  He was lost to follow-up from February 2023 to July 2024.  He was seen at that time as he was told to follow-up with our office.  In March 2023, he underwent cholecystectomy for acute cholecystitis [facility] while visiting.  Pathology revealed chronic active cholecystitis. Extensive recent hemorrhages. Adenomyomatosis.  Cholelithiasis.  Due to that month, he was admitted to [facility] with acute respiratory failure secondary to COVID-19 with superimposed bacterial pneumonia.  He did not have recurrent pulmonary emboli at that time.  He was treated with antibiotics, remdesivir and dexamethasone. He required 3L O2 which was weaned to room air by the time of discharge. He was also felt to have acute HFrEF and treated with diuretic therapy with plans for outpatient cardiology follow up for cath given concern for ischemia. He also developed a non-anion gap metabolic acidosis and was started on sodium bicarbonate tabs. His hospital course was complicated by hyperglycemia related to steroid use. He was treated with insulin glargine, though this was not continued on discharge as steroids were not prescribed for outpatient use.  He has been followed by cardiology at [facility] [place].   He developed recurrent pulmonary embolism in November 2023.  The patient presented with worsening shortness of breath.  At that time, he was found to have left lower extremity DVT, as well as bilateral pulmonary emboli with evidence of right heart strain.  He was admitted to [facility] and  placed on heparin drip.  He was discharged on apixaban, which he continues.  There was no obvious provocation for this episode of DVT/PE.  He later saw Dr. Angelene Giovanni and hypercoagulable evaluation for factor V Leiden, prothrombin gene mutation, beta-2 glycoprotein, anticardiolipin antibodies were negative/normal. Due to the recurrent episode of severe pulmonary emboli, he recommended lifelong anticoagulation.  Due to his persistent microcytosis, hemoglobin electrophoresis was ordered.  This revealed hemoglobin C trait, which explains his microcytosis.  Oncology History  Small bowel cancer (HCC)  09/13/2002 Cancer Staging   Staging form: Small Intestine, AJCC 6th Edition - Clinical stage from 09/13/2002: Stage II (T3, N0, M0) - Signed by Dellia Beckwith, MD on 03/04/2021 Staged by: Managing physician Diagnostic confirmation: Positive histology Specimen type: Excision Histopathologic type: Adenocarcinoma, intestinal type Total positive nodes: 0 Stage prefix: Initial diagnosis Lymphatic vessel invasion (L): L0 - No lymphatic vessel invasion Venous invasion (V): V0 - No venous invasion Prognostic indicators: Treated with surgery and adjuvant chemotherapy, incl doxorubicin   10/03/2002 Initial Diagnosis   Small bowel cancer (HCC)       INTERVAL HISTORY:  Perez is here today for repeat clinical assessment. He generally is doing well. He continues apixaban twice daily. He denies symptoms concerning for recurrent thrombosis. He has occasional swelling of the legs, left greater than right, which goes down overnight.  He denies fevers or chills. He  denies pain. His appetite is good. His weight has increased 5 pounds over last 4 months . He has difficulty walking and getting up and down due to bilateral knee arthritis.  He states he continues to follow with Dr. Allena Katz in nephrology.  He is clinically dehydrated today, but he feels he can push oral clear fluids. He states he knows he did not drink enough  water yesterday.  REVIEW OF SYSTEMS:  Review of Systems  Constitutional:  Negative for appetite change, chills, fatigue, fever and unexpected weight change.  HENT:   Negative for lump/mass, mouth sores, nosebleeds and sore throat.   Respiratory:  Negative for cough, hemoptysis and shortness of breath.   Cardiovascular:  Positive for leg swelling (occasional). Negative for chest pain and palpitations.  Gastrointestinal:  Negative for abdominal pain, blood in stool, constipation, diarrhea, nausea and vomiting.  Genitourinary:  Negative for difficulty urinating, dysuria, frequency and hematuria.   Musculoskeletal:  Negative for arthralgias, back pain, myalgias and neck pain.  Skin:  Negative for itching, rash and wound.  Neurological:  Negative for dizziness, extremity weakness, headaches, light-headedness and numbness.  Hematological:  Negative for adenopathy. Does not bruise/bleed easily.  Psychiatric/Behavioral:  Negative for depression and sleep disturbance. The patient is not nervous/anxious.      VITALS:  Blood pressure (!) 134/96, pulse 75, temperature (!) 97.5 F (36.4 C), temperature source Oral, resp. rate 20, height 6\' 2"  (1.88 m), weight 292 lb 6.4 oz (132.6 kg), SpO2 97%.  Wt Readings from Last 3 Encounters:  03/22/23 292 lb 6.4 oz (132.6 kg)  11/22/22 287 lb 4.8 oz (130.3 kg)  08/21/22 285 lb 4.8 oz (129.4 kg)    Body mass index is 37.54 kg/m.  Performance status (ECOG): 1 - Symptomatic but completely ambulatory  PHYSICAL EXAM:  Physical Exam Vitals and nursing note reviewed.  Constitutional:      General: He is not in acute distress.    Appearance: Normal appearance. He is obese. He is not ill-appearing.  HENT:     Head: Normocephalic and atraumatic.     Mouth/Throat:     Mouth: Mucous membranes are moist.     Pharynx: Oropharynx is clear. No oropharyngeal exudate or posterior oropharyngeal erythema.  Eyes:     General: No scleral icterus.    Extraocular  Movements: Extraocular movements intact.     Conjunctiva/sclera: Conjunctivae normal.     Pupils: Pupils are equal, round, and reactive to light.  Cardiovascular:     Rate and Rhythm: Normal rate and regular rhythm.     Heart sounds: Normal heart sounds. No murmur heard.    No friction rub. No gallop.  Pulmonary:     Effort: Pulmonary effort is normal.     Breath sounds: Normal breath sounds. No wheezing, rhonchi or rales.  Abdominal:     General: Bowel sounds are normal. There is no distension.     Palpations: Abdomen is soft. There is no mass.     Tenderness: There is no abdominal tenderness.     Comments: It is difficult to assess for hepatosplenomegaly due to body habitus  Musculoskeletal:        General: Normal range of motion.     Cervical back: Normal range of motion and neck supple. No tenderness.     Right lower leg: No edema.     Left lower leg: No edema.  Lymphadenopathy:     Cervical: No cervical adenopathy.     Upper Body:  Right upper body: No supraclavicular or axillary adenopathy.     Left upper body: No supraclavicular or axillary adenopathy.     Lower Body: No right inguinal adenopathy. No left inguinal adenopathy.  Skin:    General: Skin is warm and dry.     Coloration: Skin is not jaundiced.     Findings: No rash.  Neurological:     Mental Status: He is alert and oriented to person, place, and time.     Cranial Nerves: No cranial nerve deficit.  Psychiatric:        Mood and Affect: Mood normal.        Behavior: Behavior normal.        Thought Content: Thought content normal.     LABS:      Latest Ref Rng & Units 03/22/2023    9:37 AM 11/22/2022   10:22 AM 08/07/2022    9:34 AM  CBC  WBC 4.0 - 10.5 K/uL 6.8  6.4  5.9   Hemoglobin 13.0 - 17.0 g/dL 31.5  17.6  16.0   Hematocrit 39.0 - 52.0 % 38.3  37.6  37.4   Platelets 150 - 400 K/uL 174  182  192       Latest Ref Rng & Units 03/22/2023    9:37 AM 11/22/2022   10:23 AM 07/19/2022    9:04 AM   CMP  Glucose 70 - 99 mg/dL 737  97  106   BUN 8 - 23 mg/dL 51  25  22   Creatinine 0.61 - 1.24 mg/dL 2.69  4.85  4.62   Sodium 135 - 145 mmol/L 142  142  138   Potassium 3.5 - 5.1 mmol/L 4.6  4.8  3.9   Chloride 98 - 111 mmol/L 108  108  105   CO2 22 - 32 mmol/L 24  24  23    Calcium 8.9 - 10.3 mg/dL 9.6  9.6  9.2   Total Protein 6.5 - 8.1 g/dL 6.7  6.7  7.3   Total Bilirubin 0.0 - 1.2 mg/dL 0.7  0.7  1.6   Alkaline Phos 38 - 126 U/L 88  92  79   AST 15 - 41 U/L 19  26  25    ALT 0 - 44 U/L 15  14  19       Lab Results  Component Value Date   CEA1 3.2 07/19/2022   CEA 3.61 03/22/2023   /  CEA  Date Value Ref Range Status  07/19/2022 3.2 0.0 - 4.7 ng/mL Final    Comment:    (NOTE)                             Nonsmokers          <3.9                             Smokers             <5.6 [employer] Electrochemiluminescence Immunoassay (ECLIA) Values obtained with different assay methods or kits cannot be used interchangeably.  Results cannot be interpreted as absolute evidence of the presence or absence of malignant disease. Performed At: [facility] [address] [place], [place] [id number] Jolene Schimke MD HW:[id number]    CEA [facility])  Date Value Ref Range Status  03/22/2023 3.61 0.00 - 5.00 ng/mL Final    Comment:    (NOTE) This  test was performed using Beckman Coulter's paramagnetic chemiluminescent immunoassay. Values obtained from different assay methods cannot be used interchangeably. Please note that up to 8% of patients who smoke may see values 5.1-10.0 ng/ml and 1% of patients who smoke may see CEA levels >10.0 ng/ml. Performed at [facility], [address] [place], [place] [address]     Lab Results  Component Value Date   TOTALPROTELP 6.4 07/19/2022   ALBUMINELP 3.6 02/18/2021   A1GS 0.2 02/18/2021   A2GS 0.9 02/18/2021   BETS 1.1 02/18/2021   GAMS 1.1 02/18/2021   MSPIKE 0.3 (H) 02/18/2021   SPEI Comment  02/18/2021   Lab Results  Component Value Date   FERRITIN 164 11/22/2022   FERRITIN 189 08/07/2022   Lab Results  Component Value Date   LDH 183 07/24/2019    STUDIES:  No results found.    HISTORY:   Past Medical History:  Diagnosis Date   Allergy    sinus problems   Anemia    Arthritis    Blood transfusion without reported diagnosis 2005   Cancer [facility]) 2005   Small bowel Cancer   Clotting disorder (HCC) 2016   Depression    Diabetes mellitus without complication (HCC)    controlled by diet.   Diverticula, colon    Fatigue    Gout    history of gout   Hyperlipidemia    Hypertension    Memory loss    Microcytosis 03/22/2023   Muscle pain    and cramping   Osteoporosis    pt denies hx of this.   Personal history of malignant carcinoid tumor of small intestine 03/22/2023   Recurrent pulmonary emboli (HCC) 03/22/2023   Sleep apnea    is not on a Cpap    Past Surgical History:  Procedure Laterality Date   bowel removal     CARPAL TUNNEL WITH CUBITAL TUNNEL     COLONOSCOPY  2016   left arm surgery     LEG SURGERY     NOSE SURGERY     SMALL INTESTINE SURGERY     cancer removed and resectioned    Family History  Problem Relation Age of Onset   Stroke Mother    Arthritis Mother    Diabetes Mother    Hypertension Mother    Cancer Father    Stroke Father    Hypertension Father    Gout Father    Colon cancer Father 5   Esophageal cancer Neg Hx    Rectal cancer Neg Hx    Stomach cancer Neg Hx     Social History:  reports that he has quit smoking. He has never used smokeless tobacco. He reports that he does not drink alcohol and does not use drugs.The patient is alone today.  Allergies: No Known Allergies  Current Medications: Current Outpatient Medications  Medication Sig Dispense Refill   Azelastine HCl 137 MCG/SPRAY SOLN Place 2 sprays into both nostrils 2 (two) times daily.     ELIQUIS 5 MG TABS tablet Take 5 mg by mouth 2 (two) times  daily.     metoprolol tartrate (LOPRESSOR) 50 MG tablet Take 50 mg by mouth 2 (two) times daily.     allopurinol (ZYLOPRIM) 300 MG tablet Take 1 tablet by mouth daily.     amLODipine (NORVASC) 5 MG tablet Take 5 mg by mouth every morning.     atorvastatin (LIPITOR) 80 MG tablet Take by mouth.     Cholecalciferol 50 MCG (  2000 UT) TABS Take by mouth.     Febuxostat 80 MG TABS Take 1 tablet by mouth daily.     hydrocortisone (ANUSOL-HC) 2.5 % rectal cream Place 1 application rectally 2 (two) times daily. Hydrocortisone 2.5% cream rectal area twice daily after bowel movements for 10 days then as needed (Patient not taking: Reported on 03/22/2023) 30 g 0   losartan (COZAAR) 50 MG tablet Take by mouth.     Omega-3 1000 MG CAPS Take by mouth.     promethazine-dextromethorphan (PROMETHAZINE-DM) 6.25-15 MG/5ML syrup Take 5 mLs by mouth 4 (four) times daily as needed. (Patient not taking: Reported on 03/22/2023)     torsemide (DEMADEX) 20 MG tablet Take 1 tablet by mouth daily. Taking 1/2 tab twice daily     No current facility-administered medications for this visit.

## 2023-03-23 ENCOUNTER — Telehealth: Payer: Self-pay

## 2023-03-23 LAB — LUPUS ANTICOAGULANT PANEL
DRVVT: 47.2 s — ABNORMAL HIGH (ref 0.0–47.0)
PTT Lupus Anticoagulant: 35.4 s (ref 0.0–43.5)

## 2023-03-23 LAB — KAPPA/LAMBDA LIGHT CHAINS
Kappa free light chain: 77.3 mg/L — ABNORMAL HIGH (ref 3.3–19.4)
Kappa, lambda light chain ratio: 2.14 — ABNORMAL HIGH (ref 0.26–1.65)
Lambda free light chains: 36.1 mg/L — ABNORMAL HIGH (ref 5.7–26.3)

## 2023-03-23 LAB — PROTEIN S PANEL
Protein S Activity: 80 % (ref 63–140)
Protein S Ag, Free: 91 % (ref 61–136)
Protein S Ag, Total: 83 % (ref 60–150)

## 2023-03-23 LAB — ANTITHROMBIN PANEL
AT III AG PPP IMM-ACNC: 107 % (ref 72–124)
Antithrombin Activity: 137 % — ABNORMAL HIGH (ref 75–135)

## 2023-03-23 LAB — DRVVT CONFIRM: dRVVT Confirm: 1 ratio (ref 0.8–1.2)

## 2023-03-23 LAB — PROTEIN C DEFICIENCY PROFILE
Protein C Activity: 109 % (ref 73–180)
Protein C, Total: 96 % (ref 60–150)

## 2023-03-23 LAB — DRVVT MIX: dRVVT Mix: 40.8 s — ABNORMAL HIGH (ref 0.0–40.4)

## 2023-03-23 NOTE — Telephone Encounter (Signed)
 Detailed message left for patient.

## 2023-03-23 NOTE — Telephone Encounter (Signed)
-----   Message from Anthony Le sent at 03/22/2023  8:49 PM EDT ----- Please let him know his cancer marker was normal. Thanks

## 2023-03-26 LAB — MULTIPLE MYELOMA PANEL, SERUM
Albumin SerPl Elph-Mcnc: 3.7 g/dL (ref 2.9–4.4)
Albumin/Glob SerPl: 1.4 (ref 0.7–1.7)
Alpha 1: 0.2 g/dL (ref 0.0–0.4)
Alpha2 Glob SerPl Elph-Mcnc: 0.7 g/dL (ref 0.4–1.0)
B-Globulin SerPl Elph-Mcnc: 1 g/dL (ref 0.7–1.3)
Gamma Glob SerPl Elph-Mcnc: 0.9 g/dL (ref 0.4–1.8)
Globulin, Total: 2.8 g/dL (ref 2.2–3.9)
IgA: 490 mg/dL — ABNORMAL HIGH (ref 61–437)
IgG (Immunoglobin G), Serum: 872 mg/dL (ref 603–1613)
IgM (Immunoglobulin M), Srm: 35 mg/dL (ref 20–172)
M Protein SerPl Elph-Mcnc: 0.2 g/dL — ABNORMAL HIGH
Total Protein ELP: 6.5 g/dL (ref 6.0–8.5)

## 2023-05-12 DIAGNOSIS — M1A9XX0 Chronic gout, unspecified, without tophus (tophi): Secondary | ICD-10-CM | POA: Diagnosis not present

## 2023-05-12 DIAGNOSIS — I2699 Other pulmonary embolism without acute cor pulmonale: Secondary | ICD-10-CM | POA: Diagnosis not present

## 2023-05-12 DIAGNOSIS — E1142 Type 2 diabetes mellitus with diabetic polyneuropathy: Secondary | ICD-10-CM | POA: Diagnosis not present

## 2023-05-12 DIAGNOSIS — N183 Chronic kidney disease, stage 3 unspecified: Secondary | ICD-10-CM | POA: Diagnosis not present

## 2023-05-12 DIAGNOSIS — E785 Hyperlipidemia, unspecified: Secondary | ICD-10-CM | POA: Diagnosis not present

## 2023-05-12 DIAGNOSIS — I10 Essential (primary) hypertension: Secondary | ICD-10-CM | POA: Diagnosis not present

## 2023-05-12 DIAGNOSIS — Z125 Encounter for screening for malignant neoplasm of prostate: Secondary | ICD-10-CM | POA: Diagnosis not present

## 2023-05-12 DIAGNOSIS — Z6839 Body mass index (BMI) 39.0-39.9, adult: Secondary | ICD-10-CM | POA: Diagnosis not present

## 2023-06-22 ENCOUNTER — Inpatient Hospital Stay

## 2023-06-22 ENCOUNTER — Inpatient Hospital Stay: Admitting: Oncology

## 2023-06-28 ENCOUNTER — Inpatient Hospital Stay

## 2023-06-28 ENCOUNTER — Inpatient Hospital Stay: Admitting: Oncology

## 2023-07-04 ENCOUNTER — Other Ambulatory Visit: Payer: Self-pay | Admitting: Oncology

## 2023-07-04 DIAGNOSIS — D472 Monoclonal gammopathy: Secondary | ICD-10-CM

## 2023-07-04 DIAGNOSIS — C179 Malignant neoplasm of small intestine, unspecified: Secondary | ICD-10-CM

## 2023-07-04 NOTE — Progress Notes (Signed)
 [facility]  [address] [place],  [place]  [address] [phone]  Clinic Day:07/05/23  Referring physician: Keren Vicenta BRAVO, MD  ASSESSMENT & PLAN:  Assessment: Recurrent pulmonary emboli [facility]) Recurrent pulmonary embolism in November 2023 with previous saddle pulmonary embolism in September 2014.  The patient presented with worsening shortness of breath.  At that time, he was found to have left lower extremity DVT, as well as bilateral pulmonary emboli with evidence of right heart strain.  He was admitted to [facility] and placed on heparin drip.  He was discharged on apixaban, which he continues.  There was no obvious provocation for this episode of DVT/PE.  He later saw Dr. Bernie and hypercoagulable evaluation for factor V Leiden, prothrombin gene mutation, beta-2  glycoprotein, anticardiolipin antibodies were negative/normal. Due to the recurrent episodes of severe pulmonary emboli, we recommend lifelong anticoagulation.   Microcytosis He has had chronic microcytosis without anemia dating back to 2011.  Hemoglobin electrophoresis revealed hemoglobin C trait, which explains the microcytosis.   Monoclonal gammopathy Monoclonal gammopathy of uncertain significance, IgA kappa diagnosed in 2016.  This has not progressed and a bone marrow was performed in [place] a few years ago.  Serum protein electrophoresis, quantitative immunoglobulins and serum light chains are pending from today. His last M-spike was stable at 0.2.    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 He remains without evidence of recurrence.  Plan:  He continues Eliquis 5 mg BID without difficulty and was recommended to start using compression hose, which I agreed with. I also recommended he continue the Eliquis lifelong. He has a WBC of 6.3, hemoglobin of 13.0, and  platelet count of 178,000. His CMP is normal other than a elevated creatinine of 1.77 with a BUN of 18 improved from 2.09 with a BUN of 51. His SPEP, quantitative immunoglobulins, and serum light chains today are pending and I will call him with the results. I will see him back in 7 months with CBC, CMP, SPEP, and quantitative immunoglobulins. The patient understands the plans discussed today and is in agreement with them.  He knows to contact our office if he develops concerns prior to his next appointment.  I provided 30 minutes of face-to-face time during this encounter and > 50% was spent counseling as documented under my assessment and plan.   Wanda VEAR Cornish, MD  [facility] - A DEPT OF [facility] [address] [place] [address] Dept: [phone] Dept Fax: [phone]   No orders of the defined types were placed in this encounter.   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  This was treated with surgery and adjuvant chemotherapy.  He has never had evidence of recurrence.  We had been following him yearly.   The patient developed massive saddle pulmonary embolism in September 2014, at which time he was on testosterone .  He also had port-a-cath in place.  CTA chest at [facility] revealed significant bilateral pulmonary embolism with saddle component of clot and moderate-to-large overall pulmonary arterial thrombus  burden. There was evidence of probable right heart strain.  Due to this, he was transferred [facility].  He underwent removal of his port-a-cath, as well as placement of IVC filter.  He was discharged on enoxaparin  and transitioned to warfarin, which was monitored by Dr. Keren.  Warfarin was subsequently discontinued due to hemorrhoidal bleeding. The IVC filter was subsequently removed in 2015.   He was referred back in June 2016 because of a small monoclonal spike on serum protein electrophoresis, felt to represent monoclonal gammopathy of uncertain significance, IgA kappa, originally noted in June 2016.  The monoclonal spike has ranged from 0.2 to 0.4 for 6 years.  The urine protein electrophoresis was negative for a monoclonal spike even though he had 3+ proteinuria.  He also has had increased free kappa and lambda light chains.  He also has had chronic microcytosis, without anemia, with normal iron studies.  We had been seeing him every 6 months to follow the MGUS.  He underwent a bone marrow aspirate and biopsy back in July 2021, which was negative for multiple myeloma.  He had been referred by Dr. Douglass to Dr. Federico, since neither of them realized that we were following the MGUS.  Most recent colonoscopy in August 2021 with Dr. Charlanne was normal except for a 4 mm polyp of the ascending colon.  Surgical pathology revealed a tubular adenoma, and repeat in 5 years was recommended.  He was lost to follow-up from February 2023 to July 2024.  He was seen at that time as he was told to follow-up with our office.  In March 2023, he underwent cholecystectomy for acute cholecystitis [place] while visiting.  Pathology revealed chronic active cholecystitis. Extensive recent hemorrhages. Adenomyomatosis.  Cholelithiasis.  Due to that month, he was admitted to [facility] with acute respiratory failure secondary to COVID-19 with superimposed bacterial pneumonia.  He did not have recurrent pulmonary emboli at that time.  He was treated with antibiotics, remdesivir and dexamethasone . He required 3L O2 which was weaned to room air by the time of discharge. He was also felt to have acute HFrEF and treated with diuretic therapy  with plans for outpatient cardiology follow up for cath given concern for ischemia. He also developed a non-anion gap metabolic acidosis and was started on sodium bicarbonate tabs. His hospital course was complicated by hyperglycemia related to steroid use. He was treated with insulin glargine, though this was not continued on discharge as steroids were not prescribed for outpatient use.  He has been followed by cardiology at [facility].   He developed recurrent pulmonary embolism in November 2023.  The patient presented with worsening shortness of breath.  At that time, he was found to have left lower extremity DVT, as well as bilateral pulmonary emboli with evidence of right heart strain.  He was admitted to [facility] and placed on heparin drip.  He was discharged on apixaban, which he continues.  There was no obvious provocation for this episode of DVT/PE.  He later saw Dr. Bernie and hypercoagulable evaluation for factor V Leiden, prothrombin gene mutation, beta-2  glycoprotein, anticardiolipin antibodies were negative/normal. Due to the recurrent episode of severe pulmonary emboli, he recommended lifelong anticoagulation.  Due to his persistent microcytosis, hemoglobin electrophoresis was ordered.  This revealed hemoglobin C trait, which explains his microcytosis.  Oncology History  Small bowel cancer (HCC)  09/13/2002 Cancer Staging   Staging form: Small Intestine, AJCC 6th Edition - Clinical stage from 09/13/2002: Stage II (T3, N0, M0) - Signed  by Cornelius Wanda DEL, MD on 03/04/2021 Staged by: Managing physician Diagnostic confirmation: Positive histology Specimen type: Excision Histopathologic type: Adenocarcinoma, intestinal type Total positive nodes: 0 Stage prefix: Initial diagnosis Lymphatic vessel invasion (L): L0 - No lymphatic vessel invasion Venous invasion (V): V0 - No venous invasion Prognostic indicators: Treated with surgery and adjuvant chemotherapy, incl doxorubicin   10/03/2002  Initial Diagnosis   Small bowel cancer (HCC)     INTERVAL HISTORY:  [facility] Le is here today for repeat clinical assessment of his history of 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 He also has been followed for many years for a monoclonal gammopathy which has remained stable and is only 0.2 as of March, 2025. Patient states that he feels well and has no complaints of pain. He continues Eliquis 5 mg BID without difficulty and was recommended to start using compression hose, which I agreed with. I also recommended he continue the Eliquis lifelong. He has a WBC of 6.3, hemoglobin of 13.0, and platelet count of 178,000. His CMP is normal other than a elevated creatinine of 1.77 with a BUN of 18 improved from 2.09 with a BUN of 51. His SPEP, quantitative immunoglobulins, and serum light chains today are pending and I will call him with the results. I will see him back in 7 months with CBC, CMP, SPEP, and quantitative immunoglobulins.  He denies fever, chills, night sweats, or other signs of infection. He denies cardiorespiratory and gastrointestinal issues. He  denies pain. His appetite is good and His weight has decreased 3 pounds over last 3 months.   REVIEW OF SYSTEMS:  Review of Systems  Constitutional: Negative.  Negative for appetite change, chills, fatigue, fever and unexpected weight change.  HENT:  Negative.  Negative for lump/mass, mouth sores, nosebleeds and sore throat.   Eyes: Negative.   Respiratory: Negative.  Negative for chest tightness, cough, hemoptysis, shortness of breath and wheezing.   Cardiovascular:  Positive for leg swelling (occasional). Negative for chest pain and palpitations.  Gastrointestinal: Negative.  Negative for abdominal distention, abdominal pain, blood in stool, constipation, diarrhea, nausea and vomiting.  Endocrine: Negative.  Negative for hot flashes.  Genitourinary: Negative.  Negative for difficulty  urinating, dysuria, frequency and hematuria.   Musculoskeletal:  Negative for arthralgias, back pain, flank pain, gait problem, myalgias and neck pain.  Skin: Negative.  Negative for itching, rash and wound.  Neurological: Negative.  Negative for dizziness, extremity weakness, gait problem, headaches, light-headedness, numbness, seizures and speech difficulty.  Hematological: Negative.  Negative for adenopathy. Does not bruise/bleed easily.  Psychiatric/Behavioral: Negative.  Negative for depression and sleep disturbance. The patient is not nervous/anxious.      VITALS:  Blood pressure (!) 134/102, pulse (!) 57, temperature 97.8 F (36.6 C), temperature source Oral, resp. rate 16, height 6' 2 (1.88 m), weight 289 lb 4.8 oz (131.2 kg), SpO2 99%.  Wt Readings from Last 3 Encounters:  07/05/23 289 lb 4.8 oz (131.2 kg)  03/22/23 292 lb 6.4 oz (132.6 kg)  11/22/22 287 lb 4.8 oz (130.3 kg)    Body mass index is 37.14 kg/m.  Performance status (ECOG): 1 - Symptomatic but completely ambulatory  PHYSICAL EXAM:  Physical Exam Vitals and nursing note reviewed.  Constitutional:      General: He is not in acute distress.    Appearance: Normal appearance. He is obese. He is not ill-appearing, toxic-appearing or diaphoretic.  HENT:     Head: Normocephalic and  atraumatic.     Right Ear: Tympanic membrane, ear canal and external ear normal. There is no impacted cerumen.     Left Ear: Tympanic membrane, ear canal and external ear normal. There is no impacted cerumen.     Nose: Nose normal. No congestion or rhinorrhea.     Mouth/Throat:     Mouth: Mucous membranes are moist.     Pharynx: Oropharynx is clear. No oropharyngeal exudate or posterior oropharyngeal erythema.  Eyes:     General: No scleral icterus.       Right eye: No discharge.        Left eye: No discharge.     Extraocular Movements: Extraocular movements intact.     Conjunctiva/sclera: Conjunctivae normal.     Pupils: Pupils are  equal, round, and reactive to light.  Neck:     Vascular: No carotid bruit.  Cardiovascular:     Rate and Rhythm: Normal rate and regular rhythm.     Pulses: Normal pulses.     Heart sounds: Normal heart sounds. No murmur heard.    No friction rub. No gallop.  Pulmonary:     Effort: Pulmonary effort is normal. No respiratory distress.     Breath sounds: Normal breath sounds. No stridor. No wheezing, rhonchi or rales.  Chest:     Chest wall: No tenderness.  Abdominal:     General: Bowel sounds are normal. There is no distension.     Palpations: Abdomen is soft. There is no mass.     Tenderness: There is no abdominal tenderness. There is no right CVA tenderness, left CVA tenderness, guarding or rebound.     Hernia: No hernia is present.  Musculoskeletal:        General: No swelling, tenderness, deformity or signs of injury. Normal range of motion.     Cervical back: Normal range of motion and neck supple. No rigidity or tenderness.     Right lower leg: No edema.     Left lower leg: No edema.  Lymphadenopathy:     Cervical: No cervical adenopathy.     Upper Body:     Right upper body: No supraclavicular or axillary adenopathy.     Left upper body: No supraclavicular or axillary adenopathy.     Lower Body: No right inguinal adenopathy. No left inguinal adenopathy.  Skin:    General: Skin is warm and dry.     Coloration: Skin is not jaundiced or pale.     Findings: No bruising, erythema, lesion or rash.  Neurological:     General: No focal deficit present.     Mental Status: He is alert and oriented to person, place, and time. Mental status is at baseline.     Cranial Nerves: No cranial nerve deficit.     Sensory: No sensory deficit.     Motor: No weakness.     Coordination: Coordination normal.     Gait: Gait normal.     Deep Tendon Reflexes: Reflexes normal.  Psychiatric:        Mood and Affect: Mood normal.        Behavior: Behavior normal.        Thought Content: Thought  content normal.        Judgment: Judgment normal.     LABS:      Latest Ref Rng & Units 07/05/2023   10:25 AM 03/22/2023    9:37 AM 11/22/2022   10:22 AM  CBC  WBC 4.0 - 10.5 K/uL 6.3  6.8  6.4   Hemoglobin 13.0 - 17.0 g/dL 86.9  86.2  86.3   Hematocrit 39.0 - 52.0 % 36.1  38.3  37.6   Platelets 150 - 400 K/uL 178  174  182       Latest Ref Rng & Units 07/05/2023   10:25 AM 03/22/2023    9:37 AM 11/22/2022   10:23 AM  CMP  Glucose 70 - 99 mg/dL 95  896  97   BUN 8 - 23 mg/dL 18  51  25   Creatinine 0.61 - 1.24 mg/dL 8.22  7.90  8.06   Sodium 135 - 145 mmol/L 139  142  142   Potassium 3.5 - 5.1 mmol/L 4.2  4.6  4.8   Chloride 98 - 111 mmol/L 108  108  108   CO2 22 - 32 mmol/L 20  24  24    Calcium 8.9 - 10.3 mg/dL 9.0  9.6  9.6   Total Protein 6.5 - 8.1 g/dL 6.5  6.7  6.7   Total Bilirubin 0.0 - 1.2 mg/dL 0.9  0.7  0.7   Alkaline Phos 38 - 126 U/L 80  88  92   AST 15 - 41 U/L 20  19  26    ALT 0 - 44 U/L 12  15  14     Lab Results  Component Value Date   CEA1 3.2 07/19/2022   CEA 3.61 03/22/2023   /  CEA  Date Value Ref Range Status  07/19/2022 3.2 0.0 - 4.7 ng/mL Final    Comment:    (NOTE)                             Nonsmokers          <3.9                             Smokers             <5.6 Roche [employer] Electrochemiluminescence Immunoassay (ECLIA) Values obtained with different assay methods or kits cannot be used interchangeably.  Results cannot be interpreted as absolute evidence of the presence or absence of malignant disease. Performed At: [facility] [address] [place], [place] [id number] Jennette Shorter MD Ey:[id number]    CEA ([facility])  Date Value Ref Range Status  03/22/2023 3.61 0.00 - 5.00 ng/mL Final    Comment:    (NOTE) This test was performed using Beck[employer] Coulter's paramagnetic chemiluminescent immunoassay. Values obtained from different assay methods cannot be used interchangeably. Please note that up to 8% of patients  who smoke may see values 5.1-10.0 ng/ml and 1% of patients who smoke may see CEA levels >10.0 ng/ml. Performed at [facility], [address], [place], [place] [address]     Lab Results  Component Value Date   TOTALPROTELP 6.0 07/05/2023   ALBUMINELP 3.7 07/05/2023   A1GS 0.1 07/05/2023   A2GS 0.6 07/05/2023   BETS 0.8 07/05/2023   GAMS 0.8 07/05/2023   MSPIKE 0.2 (H) 07/05/2023   SPEI Comment 07/05/2023   Lab Results  Component Value Date   FERRITIN 164 11/22/2022   FERRITIN 189 08/07/2022   Lab Results  Component Value Date   LDH 183 07/24/2019    STUDIES:  No results found.    HISTORY:   Past Medical History:  Diagnosis Date   Allergy    sinus problems  Anemia    Arthritis    Blood transfusion without reported diagnosis 2005   Cancer [facility]) 2005   Small bowel Cancer   Clotting disorder (HCC) 2016   Depression    Diabetes mellitus without complication (HCC)    controlled by diet.   Diverticula, colon    Fatigue    Gout    history of gout   Hyperlipidemia    Hypertension    Memory loss    Microcytosis 03/22/2023   Muscle pain    and cramping   Osteoporosis    pt denies hx of this.   Personal history of malignant carcinoid tumor of small intestine 03/22/2023   Recurrent pulmonary emboli (HCC) 03/22/2023   Sleep apnea    is not on a Cpap    Past Surgical History:  Procedure Laterality Date   bowel removal     CARPAL TUNNEL WITH CUBITAL TUNNEL     COLONOSCOPY  2016   left arm surgery     LEG SURGERY     NOSE SURGERY     SMALL INTESTINE SURGERY     cancer removed and resectioned    Family History  Problem Relation Age of Onset   Stroke Mother    Arthritis Mother    Diabetes Mother    Hypertension Mother    Cancer Father    Stroke Father    Hypertension Father    Gout Father    Colon cancer Father 71   Esophageal cancer Neg Hx    Rectal cancer Neg Hx    Stomach cancer Neg Hx     Social History:  reports that  he has quit smoking. He has never used smokeless tobacco. He reports that he does not drink alcohol and does not use drugs.The patient is alone today.  Allergies: No Known Allergies  Current Medications: Current Outpatient Medications  Medication Sig Dispense Refill   promethazine-dextromethorphan (PROMETHAZINE-DM) 6.25-15 MG/5ML syrup Take 5 mLs by mouth 4 (four) times daily as needed.     allopurinol (ZYLOPRIM) 300 MG tablet Take 1 tablet by mouth daily.     amLODipine (NORVASC) 5 MG tablet Take 5 mg by mouth every morning.     atorvastatin (LIPITOR) 80 MG tablet Take by mouth.     Azelastine HCl 137 MCG/SPRAY SOLN Place 2 sprays into both nostrils 2 (two) times daily.     Cholecalciferol 50 MCG (2000 UT) TABS Take by mouth.     ELIQUIS 5 MG TABS tablet Take 5 mg by mouth 2 (two) times daily.     Febuxostat 80 MG TABS Take 1 tablet by mouth daily.     hydrocortisone  (ANUSOL -HC) 2.5 % rectal cream Place 1 application rectally 2 (two) times daily. Hydrocortisone  2.5% cream rectal area twice daily after bowel movements for 10 days then as needed (Patient not taking: Reported on 03/22/2023) 30 g 0   losartan (COZAAR) 50 MG tablet Take by mouth.     metoprolol tartrate (LOPRESSOR) 50 MG tablet Take 50 mg by mouth 2 (two) times daily.     Omega-3 1000 MG CAPS Take by mouth.     torsemide (DEMADEX) 20 MG tablet Take 1 tablet by mouth daily. Taking 1/2 tab twice daily     No current facility-administered medications for this visit.    I,Jasmine M Lassiter,acting as a scribe for Wanda VEAR Cornish, MD.,have documented all relevant documentation on the behalf of Wanda VEAR Cornish, MD,as directed by  Wanda VEAR Cornish, MD while in the  presence of Wanda VEAR Cornish, MD.

## 2023-07-05 ENCOUNTER — Other Ambulatory Visit: Payer: Self-pay | Admitting: Oncology

## 2023-07-05 ENCOUNTER — Encounter: Payer: Self-pay | Admitting: Oncology

## 2023-07-05 ENCOUNTER — Inpatient Hospital Stay (HOSPITAL_BASED_OUTPATIENT_CLINIC_OR_DEPARTMENT_OTHER): Admitting: Oncology

## 2023-07-05 ENCOUNTER — Inpatient Hospital Stay: Attending: Oncology

## 2023-07-05 ENCOUNTER — Telehealth: Payer: Self-pay | Admitting: Oncology

## 2023-07-05 VITALS — BP 134/102 | HR 57 | Temp 97.8°F | Resp 16 | Ht 74.0 in | Wt 289.3 lb

## 2023-07-05 DIAGNOSIS — Z7901 Long term (current) use of anticoagulants: Secondary | ICD-10-CM | POA: Diagnosis not present

## 2023-07-05 DIAGNOSIS — Z86718 Personal history of other venous thrombosis and embolism: Secondary | ICD-10-CM | POA: Insufficient documentation

## 2023-07-05 DIAGNOSIS — I2699 Other pulmonary embolism without acute cor pulmonale: Secondary | ICD-10-CM

## 2023-07-05 DIAGNOSIS — D472 Monoclonal gammopathy: Secondary | ICD-10-CM | POA: Diagnosis not present

## 2023-07-05 DIAGNOSIS — Z86711 Personal history of pulmonary embolism: Secondary | ICD-10-CM | POA: Diagnosis not present

## 2023-07-05 LAB — CBC WITH DIFFERENTIAL (CANCER CENTER ONLY)
Abs Immature Granulocytes: 0.02 10*3/uL (ref 0.00–0.07)
Basophils Absolute: 0 10*3/uL (ref 0.0–0.1)
Basophils Relative: 1 %
Eosinophils Absolute: 0.2 10*3/uL (ref 0.0–0.5)
Eosinophils Relative: 3 %
HCT: 36.1 % — ABNORMAL LOW (ref 39.0–52.0)
Hemoglobin: 13 g/dL (ref 13.0–17.0)
Immature Granulocytes: 0 %
Lymphocytes Relative: 21 %
Lymphs Abs: 1.3 10*3/uL (ref 0.7–4.0)
MCH: 27.8 pg (ref 26.0–34.0)
MCHC: 36 g/dL (ref 30.0–36.0)
MCV: 77.3 fL — ABNORMAL LOW (ref 80.0–100.0)
Monocytes Absolute: 0.6 10*3/uL (ref 0.1–1.0)
Monocytes Relative: 10 %
Neutro Abs: 4.2 10*3/uL (ref 1.7–7.7)
Neutrophils Relative %: 65 %
Platelet Count: 178 10*3/uL (ref 150–400)
RBC: 4.67 MIL/uL (ref 4.22–5.81)
RDW: 15.1 % (ref 11.5–15.5)
WBC Count: 6.3 10*3/uL (ref 4.0–10.5)
nRBC: 0 % (ref 0.0–0.2)

## 2023-07-05 LAB — CMP (CANCER CENTER ONLY)
ALT: 12 U/L (ref 0–44)
AST: 20 U/L (ref 15–41)
Albumin: 4.1 g/dL (ref 3.5–5.0)
Alkaline Phosphatase: 80 U/L (ref 38–126)
Anion gap: 11 (ref 5–15)
BUN: 18 mg/dL (ref 8–23)
CO2: 20 mmol/L — ABNORMAL LOW (ref 22–32)
Calcium: 9 mg/dL (ref 8.9–10.3)
Chloride: 108 mmol/L (ref 98–111)
Creatinine: 1.77 mg/dL — ABNORMAL HIGH (ref 0.61–1.24)
GFR, Estimated: 41 mL/min — ABNORMAL LOW (ref >60)
Glucose, Bld: 95 mg/dL (ref 70–99)
Potassium: 4.2 mmol/L (ref 3.5–5.1)
Sodium: 139 mmol/L (ref 135–145)
Total Bilirubin: 0.9 mg/dL (ref 0.0–1.2)
Total Protein: 6.5 g/dL (ref 6.5–8.1)

## 2023-07-05 NOTE — Telephone Encounter (Signed)
 Patient has been scheduled for follow-up visit per 07/04/23 LOS.  LVM notifying pt of appt details, provided my direct number to pt if appt changes need to be made.

## 2023-07-06 LAB — KAPPA/LAMBDA LIGHT CHAINS
Kappa free light chain: 68 mg/L — ABNORMAL HIGH (ref 3.3–19.4)
Kappa, lambda light chain ratio: 2.17 — ABNORMAL HIGH (ref 0.26–1.65)
Lambda free light chains: 31.4 mg/L — ABNORMAL HIGH (ref 5.7–26.3)

## 2023-07-06 LAB — IGG, IGA, IGM
IgA: 438 mg/dL — ABNORMAL HIGH (ref 61–437)
IgG (Immunoglobin G), Serum: 842 mg/dL (ref 603–1613)
IgM (Immunoglobulin M), Srm: 40 mg/dL (ref 20–172)

## 2023-07-09 LAB — PROTEIN ELECTROPHORESIS, SERUM
A/G Ratio: 1.6 (ref 0.7–1.7)
Albumin ELP: 3.7 g/dL (ref 2.9–4.4)
Alpha-1-Globulin: 0.1 g/dL (ref 0.0–0.4)
Alpha-2-Globulin: 0.6 g/dL (ref 0.4–1.0)
Beta Globulin: 0.8 g/dL (ref 0.7–1.3)
Gamma Globulin: 0.8 g/dL (ref 0.4–1.8)
Globulin, Total: 2.3 g/dL (ref 2.2–3.9)
M-Spike, %: 0.2 g/dL — ABNORMAL HIGH
Total Protein ELP: 6 g/dL (ref 6.0–8.5)

## 2023-07-10 ENCOUNTER — Telehealth: Payer: Self-pay

## 2023-07-10 NOTE — Telephone Encounter (Signed)
 Pt has called req test results.

## 2023-08-06 DIAGNOSIS — N183 Chronic kidney disease, stage 3 unspecified: Secondary | ICD-10-CM | POA: Diagnosis not present

## 2023-08-06 DIAGNOSIS — I129 Hypertensive chronic kidney disease with stage 1 through stage 4 chronic kidney disease, or unspecified chronic kidney disease: Secondary | ICD-10-CM | POA: Diagnosis not present

## 2023-08-09 DIAGNOSIS — R801 Persistent proteinuria, unspecified: Secondary | ICD-10-CM | POA: Diagnosis not present

## 2023-08-09 DIAGNOSIS — N1419 Nephropathy induced by other drugs, medicaments and biological substances: Secondary | ICD-10-CM | POA: Diagnosis not present

## 2023-08-09 DIAGNOSIS — T39395A Adverse effect of other nonsteroidal anti-inflammatory drugs [NSAID], initial encounter: Secondary | ICD-10-CM | POA: Diagnosis not present

## 2023-08-09 DIAGNOSIS — N183 Chronic kidney disease, stage 3 unspecified: Secondary | ICD-10-CM | POA: Diagnosis not present

## 2023-08-09 DIAGNOSIS — E119 Type 2 diabetes mellitus without complications: Secondary | ICD-10-CM | POA: Diagnosis not present

## 2023-08-09 DIAGNOSIS — E1122 Type 2 diabetes mellitus with diabetic chronic kidney disease: Secondary | ICD-10-CM | POA: Diagnosis not present

## 2023-08-09 DIAGNOSIS — I129 Hypertensive chronic kidney disease with stage 1 through stage 4 chronic kidney disease, or unspecified chronic kidney disease: Secondary | ICD-10-CM | POA: Diagnosis not present

## 2023-11-15 DIAGNOSIS — I2699 Other pulmonary embolism without acute cor pulmonale: Secondary | ICD-10-CM | POA: Diagnosis not present

## 2023-11-15 DIAGNOSIS — Z23 Encounter for immunization: Secondary | ICD-10-CM | POA: Diagnosis not present

## 2023-11-15 DIAGNOSIS — E785 Hyperlipidemia, unspecified: Secondary | ICD-10-CM | POA: Diagnosis not present

## 2023-11-15 DIAGNOSIS — E1142 Type 2 diabetes mellitus with diabetic polyneuropathy: Secondary | ICD-10-CM | POA: Diagnosis not present

## 2023-11-15 DIAGNOSIS — N183 Chronic kidney disease, stage 3 unspecified: Secondary | ICD-10-CM | POA: Diagnosis not present

## 2023-11-15 DIAGNOSIS — Z7689 Persons encountering health services in other specified circumstances: Secondary | ICD-10-CM | POA: Diagnosis not present

## 2023-11-15 DIAGNOSIS — M1A9XX0 Chronic gout, unspecified, without tophus (tophi): Secondary | ICD-10-CM | POA: Diagnosis not present

## 2023-11-15 DIAGNOSIS — I10 Essential (primary) hypertension: Secondary | ICD-10-CM | POA: Diagnosis not present

## 2024-01-16 ENCOUNTER — Other Ambulatory Visit: Payer: Self-pay | Admitting: Hematology and Oncology

## 2024-01-16 DIAGNOSIS — D472 Monoclonal gammopathy: Secondary | ICD-10-CM

## 2024-01-17 ENCOUNTER — Inpatient Hospital Stay: Payer: Self-pay | Attending: Hematology and Oncology | Admitting: Hematology and Oncology

## 2024-01-17 ENCOUNTER — Telehealth: Payer: Self-pay | Admitting: Hematology and Oncology

## 2024-01-17 ENCOUNTER — Inpatient Hospital Stay: Payer: Self-pay

## 2024-01-17 NOTE — Telephone Encounter (Signed)
 Contacted pt to reschedule missed appts from 01/17/24. Unable to reach via phone, voicemail was left.

## 2024-01-21 NOTE — Telephone Encounter (Signed)
 Pt called the office wanting to check on whether he missed an appt or not due to receiving a vm.. Wishes to call back to reschedule his missed lab/md appt since he needs to clear his schedule w/ dtr.

## 2024-01-21 NOTE — Telephone Encounter (Signed)
 Patient has been scheduled. Aware of appt date and time.

## 2024-01-23 ENCOUNTER — Inpatient Hospital Stay: Payer: Self-pay

## 2024-01-23 ENCOUNTER — Telehealth: Payer: Self-pay | Admitting: Hematology and Oncology

## 2024-01-23 ENCOUNTER — Encounter: Payer: Self-pay | Admitting: Hematology and Oncology

## 2024-01-23 ENCOUNTER — Inpatient Hospital Stay: Payer: Self-pay | Admitting: Hematology and Oncology

## 2024-01-23 ENCOUNTER — Other Ambulatory Visit: Payer: Self-pay

## 2024-01-23 VITALS — BP 132/95 | HR 64 | Temp 98.1°F | Resp 16 | Wt 303.7 lb

## 2024-01-23 DIAGNOSIS — I2699 Other pulmonary embolism without acute cor pulmonale: Secondary | ICD-10-CM

## 2024-01-23 DIAGNOSIS — R718 Other abnormality of red blood cells: Secondary | ICD-10-CM

## 2024-01-23 DIAGNOSIS — D472 Monoclonal gammopathy: Secondary | ICD-10-CM

## 2024-01-23 DIAGNOSIS — C179 Malignant neoplasm of small intestine, unspecified: Secondary | ICD-10-CM

## 2024-01-23 LAB — CBC WITH DIFFERENTIAL (CANCER CENTER ONLY)
Abs Immature Granulocytes: 0.01 K/uL (ref 0.00–0.07)
Basophils Absolute: 0.1 K/uL (ref 0.0–0.1)
Basophils Relative: 1 %
Eosinophils Absolute: 0.3 K/uL (ref 0.0–0.5)
Eosinophils Relative: 4 %
HCT: 41.5 % (ref 39.0–52.0)
Hemoglobin: 14.3 g/dL (ref 13.0–17.0)
Immature Granulocytes: 0 %
Lymphocytes Relative: 26 %
Lymphs Abs: 1.9 K/uL (ref 0.7–4.0)
MCH: 27.2 pg (ref 26.0–34.0)
MCHC: 34.5 g/dL (ref 30.0–36.0)
MCV: 79 fL — ABNORMAL LOW (ref 80.0–100.0)
Monocytes Absolute: 0.6 K/uL (ref 0.1–1.0)
Monocytes Relative: 8 %
Neutro Abs: 4.5 K/uL (ref 1.7–7.7)
Neutrophils Relative %: 61 %
Platelet Count: 198 K/uL (ref 150–400)
RBC: 5.25 MIL/uL (ref 4.22–5.81)
RDW: 15.9 % — ABNORMAL HIGH (ref 11.5–15.5)
WBC Count: 7.4 K/uL (ref 4.0–10.5)
nRBC: 0 % (ref 0.0–0.2)

## 2024-01-23 LAB — CMP (CANCER CENTER ONLY)
ALT: 20 U/L (ref 0–44)
AST: 30 U/L (ref 15–41)
Albumin: 4.3 g/dL (ref 3.5–5.0)
Alkaline Phosphatase: 101 U/L (ref 38–126)
Anion gap: 12 (ref 5–15)
BUN: 35 mg/dL — ABNORMAL HIGH (ref 8–23)
CO2: 22 mmol/L (ref 22–32)
Calcium: 9.5 mg/dL (ref 8.9–10.3)
Chloride: 108 mmol/L (ref 98–111)
Creatinine: 2.17 mg/dL — ABNORMAL HIGH (ref 0.61–1.24)
GFR, Estimated: 32 mL/min — ABNORMAL LOW
Glucose, Bld: 100 mg/dL — ABNORMAL HIGH (ref 70–99)
Potassium: 4.5 mmol/L (ref 3.5–5.1)
Sodium: 142 mmol/L (ref 135–145)
Total Bilirubin: 0.5 mg/dL (ref 0.0–1.2)
Total Protein: 7.1 g/dL (ref 6.5–8.1)

## 2024-01-23 NOTE — Assessment & Plan Note (Signed)
 Recurrent pulmonary embolism in November 2023 with previous saddle pulmonary embolism in September 2014.  The patient presented with worsening shortness of breath.  At that time, he was found to have left lower extremity DVT, as well as bilateral pulmonary emboli with evidence of right heart strain.  He was admitted to [facility] and placed on heparin drip.  He was discharged on apixaban, which he continues.  There was no obvious provocation for this episode of DVT/PE.  He later saw Dr. Angelene Giovanni and hypercoagulable evaluation for factor V Leiden, prothrombin gene mutation, beta-2 glycoprotein, anticardiolipin antibodies were negative/normal. Due to the recurrent episode of severe pulmonary emboli, he recommended lifelong anticoagulation.

## 2024-01-23 NOTE — Telephone Encounter (Signed)
 Patient has been scheduled for follow-up visit per 01/23/2024 LOS.  Pt given an appt calendar with date and time.

## 2024-01-23 NOTE — Assessment & Plan Note (Signed)
 Remote history of stage III adenocarcinoma of the small bowel treated with surgical resection and adjuvant chemotherapy. He remains without evidence of recurrence.

## 2024-01-23 NOTE — Assessment & Plan Note (Signed)
 Monoclonal gammopathy of uncertain significance, IgA kappa diagnosed in 2016.  This has not progressed and a bone marrow was performed in [place] a few years ago.  Serum protein electrophoresis, quantitative immunoglobulins and serum light chains are pending from today. His last M-spike was stable at 0.2.

## 2024-01-23 NOTE — Assessment & Plan Note (Signed)
 He has had chronic microcytosis without anemia dating back to 2011.  Hemoglobin electrophoresis revealed hemoglobin C trait, which explains the microcytosis.

## 2024-01-23 NOTE — Progress Notes (Signed)
 " [facility]  [address] [place],  [place]  [phone]  Clinic Day:  01/23/2024  Referring physician: Keren Vicenta BRAVO, MD  ASSESSMENT & PLAN:   Assessment & Plan: Monoclonal gammopathy Monoclonal gammopathy of uncertain significance, IgA kappa diagnosed in 2016.  This has not progressed and a bone marrow was performed in [place] a few years ago.  Serum protein electrophoresis, quantitative immunoglobulins and serum light chains are pending from today. His last M-spike was stable at 0.2.   Recurrent pulmonary emboli (HCC) Recurrent pulmonary embolism in November 2023 with previous saddle pulmonary embolism in September 2014.  The patient presented with worsening shortness of breath.  At that time, he was found to have left lower extremity DVT, as well as bilateral pulmonary emboli with evidence of right heart strain.  He was admitted to [facility] and placed on heparin drip.  He was discharged on apixaban, which he continues.  There was no obvious provocation for this episode of DVT/PE.  He later saw Dr. Bernie and hypercoagulable evaluation for factor V Leiden, prothrombin gene mutation, beta-2  glycoprotein, anticardiolipin antibodies were negative/normal. Due to the recurrent episode of severe pulmonary emboli, he recommended lifelong anticoagulation.  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 He remains without evidence of recurrence.   Microcytosis He has had chronic microcytosis without anemia dating back to 2011.  Hemoglobin electrophoresis revealed hemoglobin C trait, which explains the microcytosis.    The patient understands the plans discussed today and is in agreement with them.  He knows to contact our office if he develops concerns prior to his next appointment.   I provided 20 minutes of face-to-face time during this encounter and > 50%  was spent counseling as documented under my assessment and plan.    Shamyia Grandpre A Kellyann Ordway, PA-C  [facility] [place] - A DEPT OF [facility] [address] [place] [address] Dept: [phone] Dept Fax: [phone]   No orders of the defined types were placed in this encounter.     CHIEF COMPLAINT:  CC: Monoclonal gammopathy of uncertain significance  Current Treatment: Observation  HISTORY OF PRESENT ILLNESS:   Oncology History  Small bowel cancer (HCC)  09/13/2002 Cancer Staging   Staging form: Small Intestine, AJCC 6th Edition - Clinical stage from 09/13/2002: Stage II (T3, N0, M0) - Signed by Cornelius Wanda VEAR, MD on 03/04/2021 Staged by: Managing physician Diagnostic confirmation: Positive histology Specimen type: Excision Histopathologic type: Adenocarcinoma, intestinal type Total positive nodes: 0 Stage prefix: Initial diagnosis Lymphatic vessel invasion (L): L0 - No lymphatic vessel invasion Venous invasion (V): V0 - No venous invasion Prognostic indicators: Treated with surgery and adjuvant chemotherapy, incl doxorubicin   10/03/2002 Initial Diagnosis   Small bowel cancer (HCC)       INTERVAL HISTORY:   Anthony Le is here today for repeat clinical assessment.  He continues to do fairly well.  He denies new pain or other concerns concerning for progression of monoclonal gammopathy.  He continues anticoagulation as prescribed.  He denies fevers or chills. He denies pain. His appetite is good. His weight has increased 14 pounds over last 6 months.  REVIEW OF SYSTEMS:   Review of Systems  Constitutional:  Positive for fatigue. Negative for appetite change, chills, fever and unexpected weight change.  HENT:   Negative for lump/mass, mouth sores and sore throat.  Respiratory:  Negative for cough and shortness of breath.   Cardiovascular:  Negative for chest pain and leg swelling.  Gastrointestinal:  Negative for abdominal  pain, constipation, diarrhea, nausea and vomiting.  Genitourinary:  Negative for difficulty urinating, dysuria, frequency and hematuria.   Musculoskeletal:  Negative for arthralgias, back pain, myalgias and neck pain.  Skin:  Negative for itching, rash and wound.  Neurological:  Negative for dizziness, extremity weakness, headaches, light-headedness and numbness.  Hematological:  Negative for adenopathy. Does not bruise/bleed easily.  Psychiatric/Behavioral:  Negative for depression and sleep disturbance. The patient is not nervous/anxious.      VITALS:   Blood pressure (!) 132/95, pulse 64, temperature 98.1 F (36.7 C), temperature source Oral, resp. rate 16, weight (!) 303 lb 11.2 oz (137.8 kg), SpO2 99%.  Wt Readings from Last 3 Encounters:  01/23/24 (!) 303 lb 11.2 oz (137.8 kg)  07/05/23 289 lb 4.8 oz (131.2 kg)  03/22/23 292 lb 6.4 oz (132.6 kg)    Body mass index is 38.99 kg/m.  Performance status (ECOG): 1 - Symptomatic but completely ambulatory    PHYSICAL EXAM:   Physical Exam Vitals and nursing note reviewed.  Constitutional:      General: He is not in acute distress.    Appearance: Normal appearance. He is obese. He is not ill-appearing.  HENT:     Head: Normocephalic and atraumatic.     Mouth/Throat:     Mouth: Mucous membranes are moist.     Pharynx: Oropharynx is clear. No oropharyngeal exudate or posterior oropharyngeal erythema.  Eyes:     General: No scleral icterus.    Extraocular Movements: Extraocular movements intact.     Conjunctiva/sclera: Conjunctivae normal.     Pupils: Pupils are equal, round, and reactive to light.  Cardiovascular:     Rate and Rhythm: Normal rate and regular rhythm.     Heart sounds: Normal heart sounds. No murmur heard.    No friction rub. No gallop.  Pulmonary:     Effort: Pulmonary effort is normal.     Breath sounds: Normal breath sounds. No wheezing, rhonchi or rales.  Abdominal:     General: Bowel sounds are normal.  There is no distension.     Palpations: Abdomen is soft. There is no mass.     Tenderness: There is no abdominal tenderness.     Comments: No obvious hepatosplenomegaly, but difficult to assess due to habitus  Musculoskeletal:        General: Normal range of motion.     Cervical back: Normal range of motion and neck supple. No tenderness.     Right lower leg: No edema.     Left lower leg: No edema.  Lymphadenopathy:     Cervical: No cervical adenopathy.     Upper Body:     Right upper body: No supraclavicular or axillary adenopathy.     Left upper body: No supraclavicular or axillary adenopathy.  Skin:    General: Skin is warm and dry.     Coloration: Skin is not jaundiced.     Findings: No rash.  Neurological:     Mental Status: He is alert and oriented to person, place, and time.     Cranial Nerves: No cranial nerve deficit.  Psychiatric:        Mood and Affect: Mood normal.        Behavior: Behavior normal.        Thought Content: Thought content normal.  LABS:      Latest Ref Rng & Units 01/23/2024    2:41 PM 07/05/2023   10:25 AM 03/22/2023    9:37 AM  CBC  WBC 4.0 - 10.5 K/uL 7.4  6.3  6.8   Hemoglobin 13.0 - 17.0 g/dL 85.6  86.9  86.2   Hematocrit 39.0 - 52.0 % 41.5  36.1  38.3   Platelets 150 - 400 K/uL 198  178  174       Latest Ref Rng & Units 01/23/2024    2:41 PM 07/05/2023   10:25 AM 03/22/2023    9:37 AM  CMP  Glucose 70 - 99 mg/dL 899  95  896   BUN 8 - 23 mg/dL 35  18  51   Creatinine 0.61 - 1.24 mg/dL 7.82  8.22  7.90   Sodium 135 - 145 mmol/L 142  139  142   Potassium 3.5 - 5.1 mmol/L 4.5  4.2  4.6   Chloride 98 - 111 mmol/L 108  108  108   CO2 22 - 32 mmol/L 22  20  24    Calcium 8.9 - 10.3 mg/dL 9.5  9.0  9.6   Total Protein 6.5 - 8.1 g/dL 7.1  6.5  6.7   Total Bilirubin 0.0 - 1.2 mg/dL 0.5  0.9  0.7   Alkaline Phos 38 - 126 U/L 101  80  88   AST 15 - 41 U/L 30  20  19    ALT 0 - 44 U/L 20  12  15       Lab Results  Component Value Date    CEA1 3.2 07/19/2022   CEA 3.61 03/22/2023   /  CEA  Date Value Ref Range Status  07/19/2022 3.2 0.0 - 4.7 ng/mL Final    Comment:    (NOTE)                             Nonsmokers          <3.9                             Smokers             <5.6 [employer] Electrochemiluminescence Immunoassay (ECLIA) Values obtained with different assay methods or kits cannot be used interchangeably.  Results cannot be interpreted as absolute evidence of the presence or absence of malignant disease. Performed At: [facility] [address] [place], [place] [id number] Jennette Shorter MD Ey:[id number]    CEA ([facility])  Date Value Ref Range Status  03/22/2023 3.61 0.00 - 5.00 ng/mL Final    Comment:    (NOTE) This test was performed using Beckman Coulter's paramagnetic chemiluminescent immunoassay. Values obtained from different assay methods cannot be used interchangeably. Please note that up to 8% of patients who smoke may see values 5.1-10.0 ng/ml and 1% of patients who smoke may see CEA levels >10.0 ng/ml. Performed at [facility], [address], [place], [place] [address]    No results found for: PSA No results found for: CAN199 No results found for: RJW874  Lab Results  Component Value Date   TOTALPROTELP 6.0 07/05/2023   ALBUMINELP 3.7 07/05/2023   A1GS 0.1 07/05/2023   A2GS 0.6 07/05/2023   BETS 0.8 07/05/2023   GAMS 0.8 07/05/2023   MSPIKE 0.2 (H) 07/05/2023   SPEI Comment 07/05/2023   Lab Results  Component Value Date   FERRITIN 164 11/22/2022   FERRITIN 189 08/07/2022   Lab Results  Component Value Date   LDH 183 07/24/2019    STUDIES:   No results found.    HISTORY:   Past Medical History:  Diagnosis Date   Allergy    sinus problems   Anemia    Arthritis    Blood transfusion without reported diagnosis 2005   Cancer [facility]) 2005   Small bowel Cancer   Clotting disorder 2016   Depression    Diabetes mellitus  without complication (HCC)    controlled by diet.   Diverticula, colon    Fatigue    Gout    history of gout   Hyperlipidemia    Hypertension    Memory loss    Microcytosis 03/22/2023   Muscle pain    and cramping   Osteoporosis    pt denies hx of this.   Personal history of malignant carcinoid tumor of small intestine 03/22/2023   Recurrent pulmonary emboli (HCC) 03/22/2023   Sleep apnea    is not on a Cpap    Past Surgical History:  Procedure Laterality Date   bowel removal     CARPAL TUNNEL WITH CUBITAL TUNNEL     COLONOSCOPY  2016   left arm surgery     LEG SURGERY     NOSE SURGERY     SMALL INTESTINE SURGERY     cancer removed and resectioned    Family History  Problem Relation Age of Onset   Stroke Mother    Arthritis Mother    Diabetes Mother    Hypertension Mother    Cancer Father    Stroke Father    Hypertension Father    Gout Father    Colon cancer Father 83   Esophageal cancer Neg Hx    Rectal cancer Neg Hx    Stomach cancer Neg Hx     Social History:  reports that he has quit smoking. He has never used smokeless tobacco. He reports that he does not drink alcohol and does not use drugs.The patient is alone today.  Allergies: Allergies[1]  Current Medications: Current Outpatient Medications  Medication Sig Dispense Refill   allopurinol (ZYLOPRIM) 300 MG tablet Take 1 tablet by mouth daily.     amLODipine (NORVASC) 5 MG tablet Take 5 mg by mouth every morning.     atorvastatin (LIPITOR) 80 MG tablet Take by mouth.     Cholecalciferol 50 MCG (2000 UT) TABS Take by mouth.     ELIQUIS 5 MG TABS tablet Take 5 mg by mouth 2 (two) times daily.     Febuxostat 80 MG TABS Take 1 tablet by mouth daily.     hydrocortisone  (ANUSOL -HC) 2.5 % rectal cream Place 1 application rectally 2 (two) times daily. Hydrocortisone  2.5% cream rectal area twice daily after bowel movements for 10 days then as needed 30 g 0   losartan (COZAAR) 50 MG tablet Take by mouth.      metoprolol tartrate (LOPRESSOR) 50 MG tablet Take 50 mg by mouth 2 (two) times daily.     Omega-3 1000 MG CAPS Take by mouth.     torsemide (DEMADEX) 20 MG tablet Take 1 tablet by mouth daily. Taking 1/2 tab twice daily     Azelastine HCl 137 MCG/SPRAY SOLN Place 2 sprays into both nostrils 2 (two) times daily. (Patient not taking: Reported on 01/23/2024)     No current facility-administered medications for this visit.             [  1] No Known Allergies  "

## 2024-01-24 LAB — KAPPA/LAMBDA LIGHT CHAINS
Kappa free light chain: 82.6 mg/L — ABNORMAL HIGH (ref 3.3–19.4)
Kappa, lambda light chain ratio: 2.07 — ABNORMAL HIGH (ref 0.26–1.65)
Lambda free light chains: 40 mg/L — ABNORMAL HIGH (ref 5.7–26.3)

## 2024-01-24 LAB — IGG, IGA, IGM
IgA: 525 mg/dL — ABNORMAL HIGH (ref 61–437)
IgG (Immunoglobin G), Serum: 898 mg/dL (ref 603–1613)
IgM (Immunoglobulin M), Srm: 38 mg/dL (ref 20–172)

## 2024-01-25 LAB — PROTEIN ELECTROPHORESIS, SERUM
A/G Ratio: 1.2 (ref 0.7–1.7)
Albumin ELP: 3.6 g/dL (ref 2.9–4.4)
Alpha-1-Globulin: 0.2 g/dL (ref 0.0–0.4)
Alpha-2-Globulin: 0.8 g/dL (ref 0.4–1.0)
Beta Globulin: 1 g/dL (ref 0.7–1.3)
Gamma Globulin: 1.1 g/dL (ref 0.4–1.8)
Globulin, Total: 3.1 g/dL (ref 2.2–3.9)
M-Spike, %: 0.2 g/dL — ABNORMAL HIGH
Total Protein ELP: 6.7 g/dL (ref 6.0–8.5)

## 2024-02-01 ENCOUNTER — Telehealth: Payer: Self-pay

## 2024-02-01 NOTE — Telephone Encounter (Signed)
-----   Message from Andrez DELENA Foy sent at 02/01/2024  9:10 AM EST ----- Please let him know his abnormal protein in the blood is stable. Thanks

## 2024-02-01 NOTE — Telephone Encounter (Signed)
 Message sent through MyChart.

## 2024-07-23 ENCOUNTER — Inpatient Hospital Stay: Admitting: Oncology

## 2024-07-23 ENCOUNTER — Inpatient Hospital Stay
